# Patient Record
Sex: Male | Born: 1950 | Race: White | Hispanic: No | Marital: Married | Smoking: Never smoker
Health system: Southern US, Community
[De-identification: ages and names within clinical notes are randomized; demographics above are authoritative.]

## PROBLEM LIST (undated history)

## (undated) DIAGNOSIS — K219 Gastro-esophageal reflux disease without esophagitis: Secondary | ICD-10-CM

## (undated) DIAGNOSIS — K469 Unspecified abdominal hernia without obstruction or gangrene: Secondary | ICD-10-CM

## (undated) DIAGNOSIS — C801 Malignant (primary) neoplasm, unspecified: Secondary | ICD-10-CM

## (undated) DIAGNOSIS — D649 Anemia, unspecified: Secondary | ICD-10-CM

## (undated) DIAGNOSIS — J45909 Unspecified asthma, uncomplicated: Secondary | ICD-10-CM

## (undated) DIAGNOSIS — T4145XA Adverse effect of unspecified anesthetic, initial encounter: Secondary | ICD-10-CM

## (undated) DIAGNOSIS — T8859XA Other complications of anesthesia, initial encounter: Secondary | ICD-10-CM

## (undated) DIAGNOSIS — I10 Essential (primary) hypertension: Secondary | ICD-10-CM

## (undated) HISTORY — DX: Unspecified asthma, uncomplicated: J45.909

## (undated) HISTORY — DX: Unspecified abdominal hernia without obstruction or gangrene: K46.9

## (undated) HISTORY — PX: COLONOSCOPY: SHX174

## (undated) HISTORY — DX: Malignant (primary) neoplasm, unspecified: C80.1

## (undated) HISTORY — DX: Essential (primary) hypertension: I10

---
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
## 1972-10-20 HISTORY — PX: NOSE SURGERY: SHX723

## 1998-10-20 HISTORY — PX: APPENDECTOMY: SHX54

## 2001-10-20 DIAGNOSIS — C801 Malignant (primary) neoplasm, unspecified: Secondary | ICD-10-CM

## 2001-10-20 HISTORY — DX: Malignant (primary) neoplasm, unspecified: C80.1

## 2003-10-21 DIAGNOSIS — I10 Essential (primary) hypertension: Secondary | ICD-10-CM

## 2003-10-21 HISTORY — DX: Essential (primary) hypertension: I10

## 2004-09-10 ENCOUNTER — Ambulatory Visit: Payer: Self-pay | Admitting: General Surgery

## 2004-10-20 HISTORY — PX: CHOLECYSTECTOMY: SHX55

## 2005-01-29 ENCOUNTER — Ambulatory Visit: Payer: Self-pay | Admitting: General Surgery

## 2009-06-21 ENCOUNTER — Ambulatory Visit: Payer: Self-pay | Admitting: General Surgery

## 2010-04-16 ENCOUNTER — Ambulatory Visit: Payer: Self-pay | Admitting: General Surgery

## 2012-08-04 DIAGNOSIS — D649 Anemia, unspecified: Secondary | ICD-10-CM | POA: Insufficient documentation

## 2013-04-29 ENCOUNTER — Encounter: Payer: Self-pay | Admitting: *Deleted

## 2013-04-29 DIAGNOSIS — C7A8 Other malignant neuroendocrine tumors: Secondary | ICD-10-CM | POA: Insufficient documentation

## 2013-04-29 DIAGNOSIS — C801 Malignant (primary) neoplasm, unspecified: Secondary | ICD-10-CM | POA: Insufficient documentation

## 2015-04-17 ENCOUNTER — Ambulatory Visit: Payer: Self-pay | Admitting: General Surgery

## 2015-08-15 ENCOUNTER — Ambulatory Visit: Payer: Self-pay | Admitting: General Surgery

## 2015-08-27 ENCOUNTER — Encounter: Payer: Self-pay | Admitting: General Surgery

## 2015-08-28 ENCOUNTER — Encounter: Payer: Self-pay | Admitting: General Surgery

## 2015-08-28 ENCOUNTER — Ambulatory Visit (INDEPENDENT_AMBULATORY_CARE_PROVIDER_SITE_OTHER): Payer: BLUE CROSS/BLUE SHIELD | Admitting: General Surgery

## 2015-08-28 VITALS — BP 138/76 | HR 78 | Resp 16 | Ht 66.0 in | Wt 208.0 lb

## 2015-08-28 DIAGNOSIS — Z1211 Encounter for screening for malignant neoplasm of colon: Secondary | ICD-10-CM

## 2015-08-28 DIAGNOSIS — K603 Anal fistula: Secondary | ICD-10-CM | POA: Diagnosis not present

## 2015-08-28 DIAGNOSIS — Z86010 Personal history of colonic polyps: Secondary | ICD-10-CM | POA: Diagnosis not present

## 2015-08-28 MED ORDER — POLYETHYLENE GLYCOL 3350 17 GM/SCOOP PO POWD: 1.0000 | Freq: Once | ORAL | Status: DC

## 2015-08-28 NOTE — Patient Instructions (Addendum)
Colonoscopy A colonoscopy is an exam to look at your colon. This exam can help find lumps (tumors), growths (polyps), bleeding, and redness and puffiness (inflammation) in your colon.  BEFORE THE PROCEDURE  Ask your doctor about changing or stopping your regular medicines.  You may need to drink a large amount of a special liquid (oral bowel prep). You start drinking this the day before your procedure. It will cause you to have watery poop (stool). This cleans out your colon.  Do not eat or drink anything else once you have started the bowel prep, unless your doctor tells you it is safe to do so.  Make plans for someone to drive you home after the procedure. PROCEDURE  You will be given medicine to help you relax (sedative).  You will lie on your side with your knees bent.  A tube with a camera on the end is put in the opening of your butt (anus) and into your colon. Pictures are sent to a computer screen. Your doctor will look for anything that is not normal.  Your doctor may take a tissue sample (biopsy) from your colon to be looked at more closely.  The exam is finished when your doctor has viewed all of the colon. AFTER THE PROCEDURE  Do not drive for 24 hours after the exam.  You may have a small amount of blood in your poop. This is normal.  You may pass gas and have belly (abdominal) cramps. This is normal.  Ask when your test results will be ready. Make sure you get your test results.   This information is not intended to replace advice given to you by your health care provider. Make sure you discuss any questions you have with your health care provider.   Document Released: 11/08/2010 Document Revised: 10/11/2013 Document Reviewed: 06/13/2013 Elsevier Interactive Patient Education [employer].  Patient is scheduled for a Colonoscopy and Press photographer at [facility] on 09/11/15. He will pre admit by phone. He will stop his Fish Oil one week prior. He will only take his  Amlodipine at 6 am with a small sip of water. Miralax prescription has been sent into his pharmacy. Patient is aware of date and instructions.

## 2015-08-28 NOTE — Progress Notes (Signed)
Patient ID: Ronnie Reyes. Ronnie Reyes, male   DOB: 1951/10/07, 64 y.o.   MRN: [id number]  Chief Complaint  Patient presents with  . Colonoscopy    HPI Ronnie Reyes is a 64 y.o. male here today for an evaluation for a colonoscopy. His last one was done in 2011. No dietary or bowel issues, moves his bowels regularly.   The patient reports since his last colonoscopy he has had intermittent swelling, discomfort and purulent drainage occurring on a monthly schedule from tissue posterior to the anus. Scant bleeding reported. Symptoms lasted for about 1 day. Not present prior to the colonoscopy.  Recently restarted on nebulizer treatments at home. HPI  Past Medical History  Diagnosis Date  . Hypertension 2005    since 2005  . Asthma   . Hernia   . Cancer [facility]) 2003    skin cancer/arm and back    Past Surgical History  Procedure Laterality Date  . Colonoscopy  4854,[id number]    small serrated adenoma removed from the rectum at 15cm. This was his first screening colonoscopy  . Cholecystectomy  2006  . Nose surgery  1974  . Ingrown toenail removal  1964  . Appendectomy  2000    Family History  Problem Relation Age of Onset  . Cancer Father     kidney    Social History Social History  Substance Use Topics  . Smoking status: Never Smoker   . Smokeless tobacco: Never Used  . Alcohol Use: No    Allergies  Allergen Reactions  . Ace Inhibitors Shortness Of Breath  . Chlorthalidone Anaphylaxis    Current Outpatient Prescriptions  Medication Sig Dispense Refill  . albuterol (ACCUNEB) 0.63 MG/3ML nebulizer solution Take 1 ampule by nebulization every 6 (six) hours as needed for wheezing.    . Albuterol Sulfate (VENTOLIN HFA IN) Inhale 2 puffs into the lungs as needed.    Marland Kitchen amLODipine (NORVASC) 10 MG tablet   0  . Cholecalciferol (D3 SUPER STRENGTH) 2000 UNITS CAPS Take by mouth.    . fexofenadine (ALLEGRA) 180 MG tablet Take 180 mg by mouth daily.    . fluticasone (FLONASE) 50 MCG/ACT  nasal spray   3  . Fluticasone-Salmeterol (ADVAIR DISKUS) 500-50 MCG/DOSE AEPB Inhale 1 puff into the lungs 2 (two) times daily.    . Glucos-Chondroit-Hyaluron-MSM (GLUCOSAMINE CHONDROITIN JOINT) TABS Take by mouth.    . lovastatin (MEVACOR) 20 MG tablet   0  . Multiple Vitamins-Minerals (CENTRUM SILVER PO) Take by mouth once.    . naproxen sodium (ALEVE) 220 MG tablet Take 220 mg by mouth 2 (two) times daily with a meal.    . niacin 500 MG tablet Take 500 mg by mouth at bedtime.    . Omega-3 Fatty Acids (FISH OIL) 1000 MG CAPS Take by mouth once.    . ranitidine (ZANTAC) 150 MG tablet Take 150 mg by mouth 2 (two) times daily.    . polyethylene glycol powder (GLYCOLAX/MIRALAX) powder Take 255 g by mouth once. 255 g 0   No current facility-administered medications for this visit.    Review of Systems Review of Systems  Constitutional: Negative.   Respiratory: Negative.   Cardiovascular: Negative.   Gastrointestinal: Negative.     Blood pressure 138/76, pulse 78, resp. rate 16, height 5\' 6"  (1.676 m), weight 208 lb (94.348 kg), SpO2 93 %.  Physical Exam Physical Exam  Constitutional: He is oriented to person, place, and time. He appears well-developed and well-nourished.  Eyes: Conjunctivae are  normal. No scleral icterus.  Neck: Neck supple. No thyromegaly present.  Cardiovascular: Normal rate, regular rhythm and normal heart sounds.   Pulmonary/Chest: Effort normal. He has wheezes. He has rhonchi in the right lower field and the left lower field.  Mildly prolonged expiratory phase.  Genitourinary:     posterior fistula  Neurological: He is alert and oriented to person, place, and time.  Skin: Skin is warm and dry.    Data Reviewed Serrated adenoma removed from the rectum 2006. Normal colonoscopy exam 2011.  Chest x-ray obtained 08/25/2014 for wheezing showed no cardiopulmonary disease. Degenerative disease of the thoracic spine appreciated.  Assessment    Previous  colonic polyp.  Posterior anal fissure.  Hyperactive airway disease with adequate oxygenation.    Plan    The fistula can be dealt with at the time of his upcoming colonoscopy. This will be done in the operating room under anesthesia. If a deep fissure was identified, highly unlikely, would likely not perform fistulotomy. Risks of anal incontinence were reviewed.     Colonoscopy with possible biopsy/polypectomy prn: Information regarding the procedure, including its potential risks and complications (including but not limited to perforation of the bowel, which may require emergency surgery to repair, and bleeding) was verbally given to the patient. Educational information regarding lower intestinal endoscopy was given to the patient. Written instructions for how to complete the bowel prep using Miralax were provided. The importance of drinking ample fluids to avoid dehydration as a result of the prep emphasized.  Discuss posterior fistula with patient.   Patient is scheduled for a Colonoscopy and Anal Fistulotomy at [facility] on 09/11/15. He will pre admit by phone. He will stop his Fish Oil one week prior. He will only take his Amlodipine at 6 am with a small sip of water. Miralax prescription has been sent into his pharmacy. Patient is aware of date and instructions.   PCP: Ronnie Curry, MD   Ronnie Reyes 08/29/2015, 5:42 AM

## 2015-08-29 ENCOUNTER — Telehealth: Payer: Self-pay | Admitting: *Deleted

## 2015-08-29 DIAGNOSIS — Z1211 Encounter for screening for malignant neoplasm of colon: Secondary | ICD-10-CM | POA: Insufficient documentation

## 2015-08-29 DIAGNOSIS — Z86010 Personal history of colonic polyps: Secondary | ICD-10-CM | POA: Insufficient documentation

## 2015-08-29 DIAGNOSIS — K603 Anal fistula: Secondary | ICD-10-CM | POA: Insufficient documentation

## 2015-08-29 NOTE — Telephone Encounter (Signed)
Patient's wife called the office to report that they would like to cancel colonoscopy and anal fistulotomy that was scheduled for 09-11-15 at [facility]. Patient's insurance will be changing in December 2016 and they would like to wait and have done in January 2016 due to associated costs.   Trish in Endoscopy has been notified of cancellation.  Cancellation form was also faxed to the O.R.   Patient will be contacted once January schedule is available to arrange date.

## 2015-09-05 ENCOUNTER — Telehealth: Payer: Self-pay | Admitting: *Deleted

## 2015-09-05 NOTE — Telephone Encounter (Signed)
Patient has been scheduled for a colonoscopy and anal fistulotomy on 11-03-15 at [facility].  This patient has been asked to discontinue fish oil one week prior to procedure.

## 2015-09-11 ENCOUNTER — Ambulatory Visit: Admit: 2015-09-11 | Payer: Self-pay | Admitting: General Surgery

## 2015-09-11 SURGERY — COLONOSCOPY WITH PROPOFOL
Anesthesia: General

## 2015-09-25 ENCOUNTER — Encounter: Payer: Self-pay | Admitting: General Surgery

## 2015-09-27 ENCOUNTER — Encounter: Payer: Self-pay | Admitting: General Surgery

## 2015-10-10 ENCOUNTER — Other Ambulatory Visit: Payer: Self-pay | Admitting: General Surgery

## 2015-10-10 DIAGNOSIS — Z86010 Personal history of colonic polyps: Principal | ICD-10-CM

## 2015-10-10 DIAGNOSIS — Z1211 Encounter for screening for malignant neoplasm of colon: Secondary | ICD-10-CM

## 2015-10-10 DIAGNOSIS — K603 Anal fistula: Secondary | ICD-10-CM

## 2015-10-12 ENCOUNTER — Telehealth: Payer: Self-pay

## 2015-10-12 NOTE — Telephone Encounter (Signed)
Patient's wife called and wanted to cancel patient's surgery scheduled for 10/24/15. She is having trouble with his supplemental insurance and will call back to reschedule this once this issue is resolved. OR notified of cancellation and Almyra Free in Endoscopy notified of cancellation.

## 2015-10-16 ENCOUNTER — Other Ambulatory Visit: Payer: Self-pay

## 2015-10-24 ENCOUNTER — Encounter: Admission: RE | Payer: Self-pay | Source: Ambulatory Visit

## 2015-10-24 ENCOUNTER — Ambulatory Visit
Admission: RE | Admit: 2015-10-24 | Payer: BLUE CROSS/BLUE SHIELD | Source: Ambulatory Visit | Admitting: General Surgery

## 2015-10-24 SURGERY — ANAL FISTULOTOMY
Anesthesia: Choice

## 2015-10-24 SURGERY — COLONOSCOPY WITH PROPOFOL
Anesthesia: General

## 2015-11-12 ENCOUNTER — Other Ambulatory Visit: Payer: Self-pay | Admitting: *Deleted

## 2015-11-12 NOTE — Progress Notes (Signed)
Patient's colonoscopy and anal fistulotomy has been scheduled for 01-02-16 at [facility].

## 2015-12-25 ENCOUNTER — Other Ambulatory Visit: Payer: BLUE CROSS/BLUE SHIELD

## 2015-12-25 ENCOUNTER — Encounter: Payer: Self-pay | Admitting: *Deleted

## 2015-12-25 NOTE — Patient Instructions (Signed)
  Your procedure is scheduled on: 01-02-16 [facility]) Report to [place] To find out your arrival time please call [phone] between 1PM - 3PM on 01-01-16 (TUESDAY)  Remember: Instructions that are not followed completely may result in serious medical risk, up to and including death, or upon the discretion of your surgeon and anesthesiologist your surgery may need to be rescheduled.    _X___ 1. Do not eat food or drink liquids after midnight. No gum chewing or hard candies.     _X___ 2. No Alcohol for 24 hours before or after surgery.   ____ 3. Bring all medications with you on the day of surgery if instructed.    _X___ 4. Notify your doctor if there is any change in your medical condition     (cold, fever, infections).     Do not wear jewelry, make-up, hairpins, clips or nail polish.  Do not wear lotions, powders, or perfumes. You may wear deodorant.  Do not shave 48 hours prior to surgery. Men may shave face and neck.  Do not bring valuables to the hospital.    [facility] is not responsible for any belongings or valuables.               Contacts, dentures or bridgework may not be worn into surgery.  Leave your suitcase in the car. After surgery it may be brought to your room.  For patients admitted to the hospital, discharge time is determined by your treatment team.   Patients discharged the day of surgery will not be allowed to drive home.   Please read over the following fact sheets that you were given:    _X___ Take these medicines the morning of surgery with A SIP OF WATER:    1. AMLODIPINE (NORVASC)  2. ZANTAC (RANITIDINE)  3.   4.  5.  6.  ____ Fleet Enema (as directed)   ____ Use CHG Soap as directed  _X___ Use inhalers on the day of surgery-USE ADVAIR AND ALBUTEROL NEBULIZER AT HOME. BRING ALBUTEROL [place]  ____ Stop metformin 2 days prior to surgery    ____ Take 1/2 of usual insulin dose the night before  surgery and none on the morning of surgery.   ____ Stop Coumadin/Plavix/aspirin-N/A  ____ Stop Anti-inflammatories-NO NSAIDS OR ASPIRIN PRODUCTS-TYLENOL OK TO TAKE   _X___ Stop supplements until after surgery-STOP GLUCOSAMINE-CHONDROITIN NOW  ____ Bring C-Pap to the hospital.

## 2015-12-26 ENCOUNTER — Encounter
Admission: RE | Admit: 2015-12-26 | Discharge: 2015-12-26 | Disposition: A | Discharge status: Home / Self Care | Payer: Medicare Other | Source: Ambulatory Visit | Attending: General Surgery | Admitting: General Surgery

## 2015-12-26 DIAGNOSIS — Z01810 Encounter for preprocedural cardiovascular examination: Secondary | ICD-10-CM | POA: Diagnosis present

## 2015-12-26 DIAGNOSIS — Z01812 Encounter for preprocedural laboratory examination: Secondary | ICD-10-CM | POA: Insufficient documentation

## 2015-12-26 LAB — HEMOGLOBIN: Hemoglobin: 16.9 g/dL (ref 13.0–18.0)

## 2015-12-31 ENCOUNTER — Telehealth: Payer: Self-pay | Admitting: *Deleted

## 2015-12-31 NOTE — Telephone Encounter (Signed)
Patient was contacted today and confirms no medication changes since last office visit.   This patient reports that he has picked up Miralax prescription.   We will proceed with colonoscopy and anal fistulotomy as scheduled for 01-02-16 at [facility].   Patient was instructed to call the office should he have further questions.

## 2016-01-01 ENCOUNTER — Encounter: Payer: Self-pay | Admitting: *Deleted

## 2016-01-02 ENCOUNTER — Encounter: Payer: Self-pay | Admitting: *Deleted

## 2016-01-02 ENCOUNTER — Ambulatory Visit: Payer: Medicare Other | Admitting: Anesthesiology

## 2016-01-02 ENCOUNTER — Encounter
Admission: RE | Disposition: A | Discharge status: Home / Self Care | Payer: Self-pay | Source: Ambulatory Visit | Attending: General Surgery

## 2016-01-02 ENCOUNTER — Ambulatory Visit
Admission: RE | Admit: 2016-01-02 | Discharge: 2016-01-02 | Disposition: A | Discharge status: Home / Self Care | Payer: Medicare Other | Source: Ambulatory Visit | Attending: General Surgery | Admitting: General Surgery

## 2016-01-02 DIAGNOSIS — Z1211 Encounter for screening for malignant neoplasm of colon: Secondary | ICD-10-CM | POA: Diagnosis present

## 2016-01-02 DIAGNOSIS — K219 Gastro-esophageal reflux disease without esophagitis: Secondary | ICD-10-CM | POA: Diagnosis not present

## 2016-01-02 DIAGNOSIS — Z888 Allergy status to other drugs, medicaments and biological substances status: Secondary | ICD-10-CM | POA: Diagnosis not present

## 2016-01-02 DIAGNOSIS — K603 Anal fistula: Secondary | ICD-10-CM | POA: Diagnosis not present

## 2016-01-02 DIAGNOSIS — I10 Essential (primary) hypertension: Secondary | ICD-10-CM | POA: Insufficient documentation

## 2016-01-02 DIAGNOSIS — Z9049 Acquired absence of other specified parts of digestive tract: Secondary | ICD-10-CM | POA: Diagnosis not present

## 2016-01-02 DIAGNOSIS — Z86010 Personal history of colonic polyps: Secondary | ICD-10-CM | POA: Diagnosis not present

## 2016-01-02 DIAGNOSIS — J45909 Unspecified asthma, uncomplicated: Secondary | ICD-10-CM | POA: Insufficient documentation

## 2016-01-02 DIAGNOSIS — D649 Anemia, unspecified: Secondary | ICD-10-CM | POA: Diagnosis not present

## 2016-01-02 DIAGNOSIS — Z79899 Other long term (current) drug therapy: Secondary | ICD-10-CM | POA: Insufficient documentation

## 2016-01-02 DIAGNOSIS — Z7951 Long term (current) use of inhaled steroids: Secondary | ICD-10-CM | POA: Diagnosis not present

## 2016-01-02 DIAGNOSIS — Z85828 Personal history of other malignant neoplasm of skin: Secondary | ICD-10-CM | POA: Insufficient documentation

## 2016-01-02 HISTORY — DX: Gastro-esophageal reflux disease without esophagitis: K21.9

## 2016-01-02 HISTORY — DX: Other complications of anesthesia, initial encounter: T88.59XA

## 2016-01-02 HISTORY — PX: COLONOSCOPY WITH PROPOFOL: SHX5780

## 2016-01-02 HISTORY — DX: Anemia, unspecified: D64.9

## 2016-01-02 HISTORY — PX: ANAL FISTULOTOMY: SHX6423

## 2016-01-02 HISTORY — DX: Adverse effect of unspecified anesthetic, initial encounter: T41.45XA

## 2016-01-02 SURGERY — COLONOSCOPY WITH PROPOFOL
Anesthesia: General

## 2016-01-02 SURGERY — ANAL FISTULOTOMY
Anesthesia: General

## 2016-01-02 MED ORDER — SODIUM CHLORIDE 0.9 % IV SOLN
INTRAVENOUS | Status: DC
  Administered 2016-01-02: 10:00:00 via INTRAVENOUS

## 2016-01-02 MED ORDER — METHYLPREDNISOLONE SODIUM SUCC 125 MG IJ SOLR
INTRAMUSCULAR | Status: DC | PRN
  Administered 2016-01-02: 125 mg via INTRAVENOUS

## 2016-01-02 MED ORDER — FENTANYL CITRATE (PF) 100 MCG/2ML IJ SOLN: 25.0000 ug | INTRAMUSCULAR | Status: DC | PRN

## 2016-01-02 MED ORDER — MIDAZOLAM HCL 5 MG/5ML IJ SOLN
INTRAMUSCULAR | Status: DC | PRN
  Administered 2016-01-02: 2 mg via INTRAVENOUS

## 2016-01-02 MED ORDER — PROPOFOL 10 MG/ML IV BOLUS
INTRAVENOUS | Status: DC | PRN
  Administered 2016-01-02: 150 mg via INTRAVENOUS

## 2016-01-02 MED ORDER — ONDANSETRON HCL 4 MG/2ML IJ SOLN: 4.0000 mg | Freq: Once | INTRAMUSCULAR | Status: DC | PRN

## 2016-01-02 MED ORDER — ROCURONIUM BROMIDE 100 MG/10ML IV SOLN
INTRAVENOUS | Status: DC | PRN
  Administered 2016-01-02: 10 mg via INTRAVENOUS

## 2016-01-02 MED ORDER — LIDOCAINE HCL (CARDIAC) 20 MG/ML IV SOLN
INTRAVENOUS | Status: DC | PRN
  Administered 2016-01-02: 60 mg via INTRAVENOUS

## 2016-01-02 MED ORDER — PROPOFOL 500 MG/50ML IV EMUL
INTRAVENOUS | Status: DC | PRN
  Administered 2016-01-02: 120 ug/kg/min via INTRAVENOUS

## 2016-01-02 MED ORDER — LIDOCAINE HCL (CARDIAC) 20 MG/ML IV SOLN
INTRAVENOUS | Status: DC | PRN
  Administered 2016-01-02: 80 mg via INTRAVENOUS

## 2016-01-02 MED ORDER — SODIUM CHLORIDE 0.9 % IV SOLN
INTRAVENOUS | Status: DC | PRN
  Administered 2016-01-02 (×2): via INTRAVENOUS

## 2016-01-02 MED ORDER — FENTANYL CITRATE (PF) 100 MCG/2ML IJ SOLN
INTRAMUSCULAR | Status: DC | PRN
  Administered 2016-01-02 (×2): 50 ug via INTRAVENOUS

## 2016-01-02 MED ORDER — SODIUM CHLORIDE 0.9 % IV SOLN
1.0000 g | INTRAVENOUS | Status: DC
  Filled 2016-01-02: qty 1

## 2016-01-02 MED ORDER — GLYCOPYRROLATE 0.2 MG/ML IJ SOLN
INTRAMUSCULAR | Status: DC | PRN
  Administered 2016-01-02: 0.2 mg via INTRAVENOUS

## 2016-01-02 MED ORDER — BUPIVACAINE-EPINEPHRINE (PF) 0.5% -1:200000 IJ SOLN
INTRAMUSCULAR | Status: AC
  Filled 2016-01-02: qty 30

## 2016-01-02 MED ORDER — BUPIVACAINE LIPOSOME 1.3 % IJ SUSP
INTRAMUSCULAR | Status: AC
  Filled 2016-01-02: qty 20

## 2016-01-02 MED ORDER — SODIUM CHLORIDE 0.9 % IV SOLN
INTRAVENOUS | Status: DC | PRN
  Administered 2016-01-02: 11:00:00 via INTRAVENOUS

## 2016-01-02 MED ORDER — KETOROLAC TROMETHAMINE 30 MG/ML IJ SOLN
INTRAMUSCULAR | Status: DC | PRN
  Administered 2016-01-02: 30 mg via INTRAVENOUS

## 2016-01-02 MED ORDER — BUPIVACAINE-EPINEPHRINE 0.5% -1:200000 IJ SOLN
INTRAMUSCULAR | Status: DC | PRN
  Administered 2016-01-02: 30 mL

## 2016-01-02 MED ORDER — SUCCINYLCHOLINE CHLORIDE 20 MG/ML IJ SOLN
INTRAMUSCULAR | Status: DC | PRN
  Administered 2016-01-02: 100 mg via INTRAVENOUS

## 2016-01-02 MED ORDER — PROPOFOL 10 MG/ML IV BOLUS
INTRAVENOUS | Status: DC | PRN
  Administered 2016-01-02: 40 mg via INTRAVENOUS

## 2016-01-02 MED ORDER — ACETAMINOPHEN 10 MG/ML IV SOLN
INTRAVENOUS | Status: AC
  Filled 2016-01-02: qty 100

## 2016-01-02 MED ORDER — HYDROCODONE-ACETAMINOPHEN 5-325 MG PO TABS: 1.0000 | ORAL_TABLET | ORAL | Status: DC | PRN

## 2016-01-02 MED ORDER — ACETAMINOPHEN 10 MG/ML IV SOLN
INTRAVENOUS | Status: DC | PRN
  Administered 2016-01-02: 1000 mg via INTRAVENOUS

## 2016-01-02 MED ORDER — SODIUM CHLORIDE 0.9 % IV SOLN
1.0000 g | INTRAVENOUS | Status: DC | PRN
  Administered 2016-01-02: 1 g via INTRAVENOUS

## 2016-01-02 MED ORDER — ONDANSETRON HCL 4 MG/2ML IJ SOLN
INTRAMUSCULAR | Status: DC | PRN
  Administered 2016-01-02: 4 mg via INTRAVENOUS

## 2016-01-02 SURGICAL SUPPLY — 28 items
BLADE SURG 15 STRL SS SAFETY (BLADE) ×3 IMPLANT
BRIEF STRETCH MATERNITY 2XLG (MISCELLANEOUS) ×3 IMPLANT
CANISTER SUCT 1200ML W/VALVE (MISCELLANEOUS) ×3 IMPLANT
DRAPE LAPAROTOMY 100X77 ABD (DRAPES) ×3 IMPLANT
DRAPE LEGGINS SURG 28X43 STRL (DRAPES) ×3 IMPLANT
DRAPE UNDER BUTTOCK W/FLU (DRAPES) ×3 IMPLANT
DRSG GAUZE PETRO 6X36 STRIP ST (GAUZE/BANDAGES/DRESSINGS) ×3 IMPLANT
ELECT REM PT RETURN 9FT ADLT (ELECTROSURGICAL) ×3
ELECTRODE REM PT RTRN 9FT ADLT (ELECTROSURGICAL) ×1 IMPLANT
GLOVE BIO SURGEON STRL SZ7.5 (GLOVE) ×3 IMPLANT
GLOVE INDICATOR 8.0 STRL GRN (GLOVE) ×3 IMPLANT
GOWN STRL REUS W/ TWL LRG LVL3 (GOWN DISPOSABLE) ×2 IMPLANT
GOWN STRL REUS W/TWL LRG LVL3 (GOWN DISPOSABLE) ×4
KIT RM TURNOVER STRD PROC AR (KITS) ×3 IMPLANT
LABEL OR SOLS (LABEL) ×3 IMPLANT
NDL SAFETY 22GX1.5 (NEEDLE) ×3 IMPLANT
NEEDLE HYPO 25X1 1.5 SAFETY (NEEDLE) ×3 IMPLANT
NS IRRIG 500ML POUR BTL (IV SOLUTION) ×3 IMPLANT
PACK BASIN MINOR ARMC (MISCELLANEOUS) ×3 IMPLANT
PAD OB MATERNITY 4.3X12.25 (PERSONAL CARE ITEMS) ×3 IMPLANT
PAD PREP 24X41 OB/GYN DISP (PERSONAL CARE ITEMS) ×3 IMPLANT
SOL PREP PVP 2OZ (MISCELLANEOUS) ×3
SOLUTION PREP PVP 2OZ (MISCELLANEOUS) ×1 IMPLANT
SURGILUBE 2OZ TUBE FLIPTOP (MISCELLANEOUS) ×3 IMPLANT
SUT SILK 0 CT 1 30 (SUTURE) ×3 IMPLANT
SUT VIC AB 3-0 SH 27 (SUTURE) ×2
SUT VIC AB 3-0 SH 27X BRD (SUTURE) ×1 IMPLANT
SYR CONTROL 10ML (SYRINGE) ×3 IMPLANT

## 2016-01-02 NOTE — Anesthesia Postprocedure Evaluation (Signed)
Anesthesia Post Note  Patient: Ronnie Reyes  Procedure(s) Performed: Procedure(s) (LRB): COLONOSCOPY WITH PROPOFOL (N/A)  Patient location during evaluation: PACU Anesthesia Type: General Level of consciousness: awake and alert and oriented Pain management: pain level controlled Vital Signs Assessment: post-procedure vital signs reviewed and stable Respiratory status: spontaneous breathing Cardiovascular status: blood pressure returned to baseline Anesthetic complications: no    Last Vitals:  Filed Vitals:   01/02/16 1400 01/02/16 1415  BP: 119/75 142/60  Pulse: 86 83  Temp: 36 C   Resp: 16 16    Last Pain:  Filed Vitals:   01/02/16 1426  PainSc: 0-No pain                 Jaycelynn Knickerbocker

## 2016-01-02 NOTE — Transfer of Care (Signed)
Immediate Anesthesia Transfer of Care Note  Patient: Ronnie Reyes  Procedure(s) Performed: Procedure(s): ANAL FISTULOTOMY (N/A)  Patient Location: PACU  Anesthesia Type:General  Level of Consciousness: sedated  Airway & Oxygen Therapy: Patient Spontanous Breathing and Patient connected to face mask oxygen  Post-op Assessment: Report given to RN and Post -op Vital signs reviewed and stable  Post vital signs: Reviewed and stable  Last Vitals:  Filed Vitals:   01/02/16 1203 01/02/16 1301  BP: 126/85 140/75  Pulse: 85 92  Temp: 36.2 C 36.2 C  Resp: 28 21    Complications: No apparent anesthesia complications

## 2016-01-02 NOTE — Transfer of Care (Signed)
Immediate Anesthesia Transfer of Care Note  Patient: Ronnie Reyes  Procedure(s) Performed: Procedure(s): COLONOSCOPY WITH PROPOFOL (N/A)  Patient Location: Endoscopy Unit  Anesthesia Type:General  Level of Consciousness: awake, alert , oriented and patient cooperative  Airway & Oxygen Therapy: Patient Spontanous Breathing and Patient connected to nasal cannula oxygen  Post-op Assessment: Report given to RN, Post -op Vital signs reviewed and stable and Patient moving all extremities X 4  Post vital signs: Reviewed and stable  Last Vitals:  Filed Vitals:   01/02/16 0957  BP: 153/99  Pulse: 87  Temp: 37.2 C  Resp: 16    Complications: No apparent anesthesia complications

## 2016-01-02 NOTE — Progress Notes (Signed)
abx Colbert Ewing will be sent to ENDO with patient

## 2016-01-02 NOTE — Anesthesia Preprocedure Evaluation (Signed)
Anesthesia Evaluation  Patient identified by MRN, date of birth, ID band Patient awake  General Assessment Comment:Hard time waking up after appendectomy  Reviewed: Allergy & Precautions, NPO status , Patient's Chart, lab work & pertinent test results  History of Anesthesia Complications (+) history of anesthetic complications  Airway Mallampati: II       Dental  (+) Caps, Chipped   Pulmonary asthma , COPD,  COPD inhaler,    Pulmonary exam normal breath sounds clear to auscultation       Cardiovascular hypertension, Pt. on medications Normal cardiovascular exam     Neuro/Psych    GI/Hepatic GERD  Medicated and Controlled,Anal fissure..     Endo/Other  negative endocrine ROS  Renal/GU negative Renal ROS  negative genitourinary   Musculoskeletal negative musculoskeletal ROS (+)   Abdominal Normal abdominal exam  (+)   Peds negative pediatric ROS (+)  Hematology  (+) anemia ,   Anesthesia Other Findings Skin CA  Reproductive/Obstetrics                             Anesthesia Physical Anesthesia Plan  ASA: III  Anesthesia Plan: General   Post-op Pain Management:    Induction: Intravenous  Airway Management Planned: Oral ETT  Additional Equipment:   Intra-op Plan:   Post-operative Plan: Extubation in OR  Informed Consent: I have reviewed the patients History and Physical, chart, labs and discussed the procedure including the risks, benefits and alternatives for the proposed anesthesia with the patient or authorized representative who has indicated his/her understanding and acceptance.   Dental advisory given  Plan Discussed with: CRNA and Surgeon  Anesthesia Plan Comments:         Anesthesia Quick Evaluation

## 2016-01-02 NOTE — Progress Notes (Signed)
To ENDO via stretcher by Dessie Coma, CNA

## 2016-01-02 NOTE — OR Nursing (Signed)
Patient appears to be teary at times, but denies pain.

## 2016-01-02 NOTE — Anesthesia Postprocedure Evaluation (Signed)
Anesthesia Post Note  Patient: Ronnie Reyes  Procedure(s) Performed: Procedure(s) (LRB): ANAL FISTULOTOMY (N/A)  Patient location during evaluation: PACU Anesthesia Type: General Level of consciousness: awake, awake and alert and oriented Pain management: pain level controlled Vital Signs Assessment: post-procedure vital signs reviewed and stable Respiratory status: spontaneous breathing Cardiovascular status: blood pressure returned to baseline Anesthetic complications: no    Last Vitals:  Filed Vitals:   01/02/16 1400 01/02/16 1415  BP: 119/75 142/60  Pulse: 86 83  Temp: 36 C   Resp: 16 16    Last Pain:  Filed Vitals:   01/02/16 1426  PainSc: 0-No pain                 Renu Asby

## 2016-01-02 NOTE — Op Note (Signed)
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  Postoperative diagnosis: Same.  Operative procedure: Anoscopy, fistulotomy.  Operative surgeon: Ollen Bowl, M.D.  Anesthesia: Gen. endotracheal, Marcaine 0.5% with 1-200,000 units epinephrine, 30 cc local infiltration  Estimated blood loss: None.  Clinical note: This 65 year old male has had long-standing recurrent drainage from a nodule approximately 3 cm from the anus at the 6:00 position (dorsal lithotomy). Exam was consistent with a fistula in anal. Colonoscopy earlier today was unremarkable for rectal pathology.  The patient received Invanz prior to procedure.  Operative note: With the patient in dorsal lithotomy position under general anesthesia the area was prepped with Betadine solution and draped. The fistula was cannulated with a lacrimal duct probe and a fistulotomy completed with cautery. This was fairly shallow through the superficial sphincter. Bleeding was controlled with electrocautery. The tract was curetted completely clean base. A Vaseline gauze pack was placed but quickly expelled when the patient coughed. A dry pad was placed and the patient was taken to recovery room in stable condition.

## 2016-01-02 NOTE — Anesthesia Procedure Notes (Signed)
Procedure Name: Intubation Date/Time: 01/02/2016 12:15 PM Performed by: Delaney Meigs Pre-anesthesia Checklist: Patient identified, Emergency Drugs available, Suction available, Patient being monitored and Timeout performed Patient Re-evaluated:Patient Re-evaluated prior to inductionOxygen Delivery Method: Circle system utilized Preoxygenation: Pre-oxygenation with 100% oxygen Intubation Type: IV induction Ventilation: Mask ventilation without difficulty Laryngoscope Size: Mac and 3 Tube type: Oral Tube size: 7.5 mm Number of attempts: 1 Airway Equipment and Method: Stylet Placement Confirmation: ETT inserted through vocal cords under direct vision,  positive ETCO2 and breath sounds checked- equal and bilateral Secured at: 22 cm Tube secured with: Tape Dental Injury: Teeth and Oropharynx as per pre-operative assessment

## 2016-01-02 NOTE — H&P (Signed)
Ronnie Reyes [id number] 04/15/1951     HPI: Long standing anal fistula. For colonscopy, fistulotomy. Did experience N&V w/ prep. Last BM liquid.  No change in health since December 2016 exam. Continues with monthly fistula drainage.   Prescriptions prior to admission  Medication Sig Dispense Refill Last Dose  . albuterol (ACCUNEB) 0.63 MG/3ML nebulizer solution Take 1 ampule by nebulization every 6 (six) hours as needed for wheezing.   01/02/2016 at 0600  . Albuterol Sulfate (VENTOLIN HFA IN) Inhale 2 puffs into the lungs as needed.   01/02/2016 at 0600  . amLODipine (NORVASC) 10 MG tablet Take 10 mg by mouth every morning.   0 01/02/2016 at 0600  . Cholecalciferol (D3 SUPER STRENGTH) 2000 UNITS CAPS Take by mouth.   01/01/2016 at am  . fexofenadine (ALLEGRA) 180 MG tablet Take 180 mg by mouth daily.   01/01/2016 at am  . fluticasone (FLONASE) 50 MCG/ACT nasal spray Place 1 spray into both nostrils daily.   3 01/01/2016 at am  . Fluticasone-Salmeterol (ADVAIR DISKUS) 500-50 MCG/DOSE AEPB Inhale 1 puff into the lungs every morning.    01/02/2016 at 0600  . Glucos-Chondroit-Hyaluron-MSM (GLUCOSAMINE CHONDROITIN JOINT) TABS Take by mouth.   12/26/2015  . Iron TABS Take 1 tablet by mouth every morning.   12/26/2015  . lovastatin (MEVACOR) 20 MG tablet Take 20 mg by mouth daily at 6 PM.   0 01/01/2016 at pm  . Multiple Vitamins-Minerals (CENTRUM SILVER PO) Take by mouth once.   12/26/2015  . niacin 500 MG tablet Take 500 mg by mouth at bedtime.   01/01/2016 at am  . Omega-3 Fatty Acids (FISH OIL) 1000 MG CAPS Take by mouth once.   12/18/2015  . polyethylene glycol powder (GLYCOLAX/MIRALAX) powder Take 255 g by mouth once. 255 g 0 01/01/2016  . ranitidine (ZANTAC) 150 MG tablet Take 150 mg by mouth every morning.    01/02/2016 at 0600   Allergies  Allergen Reactions  . Ace Inhibitors Shortness Of Breath  . Chlorthalidone Anaphylaxis   Past Medical History  Diagnosis Date  . Hypertension 2005    since  2005  . Asthma   . Hernia   . Cancer ([place]) 2003    skin cancer/arm and back  . GERD (gastroesophageal reflux disease)   . Anemia   . Complication of anesthesia     HARD TIME WAKING UP AFTER  APPENDECTOMY   Past Surgical History  Procedure Laterality Date  . Colonoscopy  [id number]    small serrated adenoma removed from the rectum at 15cm. This was his first screening colonoscopy  . Cholecystectomy  2006  . Nose surgery  1974  . Ingrown toenail removal  1964  . Appendectomy  2000   Social History   Social History  . Marital Status: Married    Spouse Name: N/A  . Number of Children: N/A  . Years of Education: N/A   Occupational History  . Not on file.   Social History Main Topics  . Smoking status: Never Smoker   . Smokeless tobacco: Never Used  . Alcohol Use: No  . Drug Use: No  . Sexual Activity: Not on file   Other Topics Concern  . Not on file   Social History Narrative   Social History   Social History Narrative     ROS: Negative.     PE: HEENT: Negative. Lungs: Clear. Cardio: RR. Robert Bellow 01/02/2016   Assessment/Plan:  Proceed with planned endoscopy and fistulotomy.

## 2016-01-02 NOTE — Anesthesia Preprocedure Evaluation (Addendum)
Anesthesia Evaluation  Patient identified by MRN, date of birth, ID band Patient awake  General Assessment Comment:Hard time waking up after appendectomy  Reviewed: Allergy & Precautions, NPO status , Patient's Chart, lab work & pertinent test results  History of Anesthesia Complications (+) history of anesthetic complications  Airway Mallampati: II       Dental  (+) Caps, Chipped   Pulmonary asthma , COPD,  COPD inhaler,    Pulmonary exam normal breath sounds clear to auscultation       Cardiovascular hypertension, Pt. on medications Normal cardiovascular exam     Neuro/Psych    GI/Hepatic GERD  Medicated and Controlled,Anal fissure..     Endo/Other  negative endocrine ROS  Renal/GU negative Renal ROS  negative genitourinary   Musculoskeletal negative musculoskeletal ROS (+)   Abdominal Normal abdominal exam  (+)   Peds negative pediatric ROS (+)  Hematology  (+) anemia ,   Anesthesia Other Findings Skin CA  Reproductive/Obstetrics                        Anesthesia Physical Anesthesia Plan  ASA: III  Anesthesia Plan: General   Post-op Pain Management:    Induction: Intravenous  Airway Management Planned: Nasal Cannula  Additional Equipment:   Intra-op Plan:   Post-operative Plan:   Informed Consent: I have reviewed the patients History and Physical, chart, labs and discussed the procedure including the risks, benefits and alternatives for the proposed anesthesia with the patient or authorized representative who has indicated his/her understanding and acceptance.   Dental advisory given  Plan Discussed with: CRNA and Surgeon  Anesthesia Plan Comments: (GOT for anal fissure surgery)       Anesthesia Quick Evaluation

## 2016-01-02 NOTE — Op Note (Signed)
[facility] Patient Name: Ronnie Reyes Procedure Date: 01/02/2016 11:30 AM MRN: [id number] Account #: [id number] Date of Birth: 08-10-1951 Admit Type: Outpatient Age: 65 Room: [facility] ENDO ROOM 3 Gender: Male Note Status: Finalized Procedure:            Colonoscopy Indications:          Screening for colorectal malignant neoplasm Providers:            Robert Bellow, MD Referring MD:         Atilano Median, MD (Referring MD) Medicines:            Monitored Anesthesia Care Complications:        No immediate complications. Procedure:            Pre-Anesthesia Assessment:                       - Prior to the procedure, a History and Physical was                        performed, and patient medications, allergies and                        sensitivities were reviewed. The patient's tolerance of                        previous anesthesia was reviewed.                       - The risks and benefits of the procedure and the                        sedation options and risks were discussed with the                        patient. All questions were answered and informed                        consent was obtained.                       After obtaining informed consent, the colonoscope was                        passed under direct vision. Throughout the procedure,                        the patient's blood pressure, pulse, and oxygen                        saturations were monitored continuously. The                        Colonoscope was introduced through the anus and                        advanced to the the cecum, identified by appendiceal                        orifice and ileocecal valve. The colonoscopy was  performed without difficulty. The patient tolerated the                        procedure well. The quality of the bowel preparation                        was excellent. Findings:      The entire examined colon  appeared normal on direct and retroflexion       views. Impression:           - The entire examined colon is normal on direct and                        retroflexion views.                       - No specimens collected. Recommendation:       - Repeat colonoscopy in 10 years for screening purposes. Procedure Code(s):    --- Professional ---                       [phone], Colonoscopy, flexible; diagnostic, including                        collection of specimen(s) by brushing or washing, when                        performed (separate procedure) Diagnosis Code(s):    --- Professional ---                       Z12.11, Encounter for screening for malignant neoplasm                        of colon CPT copyright 2016 [employer]. All rights reserved. The codes documented in this report are preliminary and upon coder review may  be revised to meet current compliance requirements. Robert Bellow, MD 01/02/2016 11:54:49 AM This report has been signed electronically. Number of Addenda: 0 Note Initiated On: 01/02/2016 11:30 AM Scope Withdrawal Time: 0 hours 7 minutes 12 seconds  Total Procedure Duration: 0 hours 12 minutes 25 seconds       [facility]

## 2016-01-10 ENCOUNTER — Encounter: Payer: Self-pay | Admitting: General Surgery

## 2016-01-10 ENCOUNTER — Ambulatory Visit (INDEPENDENT_AMBULATORY_CARE_PROVIDER_SITE_OTHER): Payer: Medicare Other | Admitting: General Surgery

## 2016-01-10 VITALS — BP 130/76 | HR 74 | Resp 16 | Ht 66.0 in | Wt 207.0 lb

## 2016-01-10 DIAGNOSIS — Z1211 Encounter for screening for malignant neoplasm of colon: Secondary | ICD-10-CM

## 2016-01-10 DIAGNOSIS — K603 Anal fistula: Secondary | ICD-10-CM

## 2016-01-10 DIAGNOSIS — Z86010 Personal history of colonic polyps: Secondary | ICD-10-CM

## 2016-01-10 NOTE — Progress Notes (Signed)
Patient ID: Ronnie Reyes, male   DOB: 06-30-1951, 65 y.o.   MRN: [id number]  Chief Complaint  Patient presents with  . Routine Post Op    colonoscopy and fistula    HPI Ronnie Reyes is a 65 y.o. male here today for his post op colonoscopy and fistula done on 01/02/16. Patient states no pain just some sorenessAt the anus. He has had a small amount of drainage and continues to make use of a peripads. No difficulty controlling bowel movements. HPI  Past Medical History  Diagnosis Date  . Hypertension 2005    since 2005  . Asthma   . Hernia   . Cancer ([place]) 2003    skin cancer/arm and back  . GERD (gastroesophageal reflux disease)   . Anemia   . Complication of anesthesia     HARD TIME WAKING UP AFTER  APPENDECTOMY    Past Surgical History  Procedure Laterality Date  . Colonoscopy  [id number]    small serrated adenoma removed from the rectum at 15cm. This was his first screening colonoscopy  . Cholecystectomy  2006  . Nose surgery  1974  . Ingrown toenail removal  1964  . Appendectomy  2000  . Colonoscopy with propofol N/A 01/02/2016    Procedure: COLONOSCOPY WITH PROPOFOL;  Surgeon: Robert Bellow, MD;  Location: [facility] ENDOSCOPY;  Service: Endoscopy;  Laterality: N/A;  . Anal fistulotomy N/A 01/02/2016    Procedure: ANAL FISTULOTOMY;  Surgeon: Robert Bellow, MD;  Location: [facility] ORS;  Service: General;  Laterality: N/A;    Family History  Problem Relation Age of Onset  . Cancer Father     kidney    Social History Social History  Substance Use Topics  . Smoking status: Never Smoker   . Smokeless tobacco: Never Used  . Alcohol Use: No    Allergies  Allergen Reactions  . Ace Inhibitors Shortness Of Breath  . Chlorthalidone Anaphylaxis    Current Outpatient Prescriptions  Medication Sig Dispense Refill  . albuterol (ACCUNEB) 0.63 MG/3ML nebulizer solution Take 1 ampule by nebulization every 6 (six) hours as needed for wheezing.    . Albuterol Sulfate  (VENTOLIN HFA IN) Inhale 2 puffs into the lungs as needed.    Marland Kitchen amLODipine (NORVASC) 10 MG tablet Take 10 mg by mouth every morning.   0  . Cholecalciferol (D3 SUPER STRENGTH) 2000 UNITS CAPS Take by mouth.    . fexofenadine (ALLEGRA) 180 MG tablet Take 180 mg by mouth daily.    . fluticasone (FLONASE) 50 MCG/ACT nasal spray Place 1 spray into both nostrils daily.   3  . Fluticasone-Salmeterol (ADVAIR DISKUS) 500-50 MCG/DOSE AEPB Inhale 1 puff into the lungs every morning.     . Glucos-Chondroit-Hyaluron-MSM (GLUCOSAMINE CHONDROITIN JOINT) TABS Take by mouth.    Marland Kitchen HYDROcodone-acetaminophen (NORCO) 5-325 MG tablet Take 1-2 tablets by mouth every 4 (four) hours as needed for moderate pain. 30 tablet 0  . Iron TABS Take 1 tablet by mouth every morning.    . lovastatin (MEVACOR) 20 MG tablet Take 20 mg by mouth daily at 6 PM.   0  . Multiple Vitamins-Minerals (CENTRUM SILVER PO) Take by mouth once.    . niacin 500 MG tablet Take 500 mg by mouth at bedtime.    . Omega-3 Fatty Acids (FISH OIL) 1000 MG CAPS Take by mouth once.    . ranitidine (ZANTAC) 150 MG tablet Take 150 mg by mouth every morning.      No  current facility-administered medications for this visit.    Review of Systems Review of Systems  Constitutional: Negative.   Respiratory: Negative.   Cardiovascular: Negative.     Blood pressure 130/76, pulse 74, resp. rate 16, height 5\' 6"  (1.676 m), weight 207 lb (93.895 kg).  Physical Exam Physical Exam  Genitourinary:       Data Reviewed Colonoscopy of 01/02/2016 was normal. Follow-up exam in 10 years.  Assessment    Good progress status post anal fistulotomy.    Plan    Follow-up examine in one month. Continue local wound care.     PCP:  Astrid Divine This information has been scribed by Gaspar Cola CMA.   Robert Bellow 01/11/2016, 8:10 AM

## 2016-01-10 NOTE — Patient Instructions (Signed)
Patient to return as needed. Recall for colonoscopy

## 2016-02-11 ENCOUNTER — Ambulatory Visit (INDEPENDENT_AMBULATORY_CARE_PROVIDER_SITE_OTHER): Payer: Medicare Other | Admitting: General Surgery

## 2016-02-11 ENCOUNTER — Encounter: Payer: Self-pay | Admitting: General Surgery

## 2016-02-11 VITALS — BP 136/80 | HR 78 | Resp 12 | Ht 66.0 in | Wt 206.0 lb

## 2016-02-11 DIAGNOSIS — K603 Anal fistula: Secondary | ICD-10-CM

## 2016-02-11 DIAGNOSIS — Z86010 Personal history of colonic polyps: Secondary | ICD-10-CM

## 2016-02-11 DIAGNOSIS — Z1211 Encounter for screening for malignant neoplasm of colon: Secondary | ICD-10-CM

## 2016-02-11 NOTE — Patient Instructions (Signed)
Patient to return asa needed.

## 2016-02-11 NOTE — Progress Notes (Signed)
Patient ID: Ronnie Reyes, male   DOB: 1951/05/23, 65 y.o.   MRN: [id number]  Chief Complaint  Patient presents with  . Follow-up    anual fissure    HPI Ronnie Reyes is a 65 y.o. male  here today for his post op colonoscopy and fistula done on 01/02/16. Patient states no pain or discomfort.  I personally reviewed the patient's history. HPI  Past Medical History  Diagnosis Date  . Hypertension 2005    since 2005  . Asthma   . Hernia   . Cancer ([place]) 2003    skin cancer/arm and back  . GERD (gastroesophageal reflux disease)   . Anemia   . Complication of anesthesia     HARD TIME WAKING UP AFTER  APPENDECTOMY    Past Surgical History  Procedure Laterality Date  . Colonoscopy  [id number]    small serrated adenoma removed from the rectum at 15cm. This was his first screening colonoscopy  . Cholecystectomy  2006  . Nose surgery  1974  . Ingrown toenail removal  1964  . Appendectomy  2000  . Colonoscopy with propofol N/A 01/02/2016    Procedure: COLONOSCOPY WITH PROPOFOL;  Surgeon: Robert Bellow, MD;  Location: [facility] ENDOSCOPY;  Service: Endoscopy;  Laterality: N/A;  . Anal fistulotomy N/A 01/02/2016    Procedure: ANAL FISTULOTOMY;  Surgeon: Robert Bellow, MD;  Location: [facility] ORS;  Service: General;  Laterality: N/A;    Family History  Problem Relation Age of Onset  . Cancer Father     kidney    Social History Social History  Substance Use Topics  . Smoking status: Never Smoker   . Smokeless tobacco: Never Used  . Alcohol Use: No    Allergies  Allergen Reactions  . Ace Inhibitors Shortness Of Breath  . Chlorthalidone Anaphylaxis    Current Outpatient Prescriptions  Medication Sig Dispense Refill  . albuterol (ACCUNEB) 0.63 MG/3ML nebulizer solution Take 1 ampule by nebulization every 6 (six) hours as needed for wheezing.    . Albuterol Sulfate (VENTOLIN HFA IN) Inhale 2 puffs into the lungs as needed.    Marland Kitchen amLODipine (NORVASC) 10 MG tablet Take 10 mg  by mouth every morning.   0  . Cholecalciferol (D3 SUPER STRENGTH) 2000 UNITS CAPS Take by mouth.    . fexofenadine (ALLEGRA) 180 MG tablet Take 180 mg by mouth daily.    . fluticasone (FLONASE) 50 MCG/ACT nasal spray Place 1 spray into both nostrils daily.   3  . Fluticasone-Salmeterol (ADVAIR DISKUS) 500-50 MCG/DOSE AEPB Inhale 1 puff into the lungs every morning.     . Glucos-Chondroit-Hyaluron-MSM (GLUCOSAMINE CHONDROITIN JOINT) TABS Take by mouth.    Marland Kitchen HYDROcodone-acetaminophen (NORCO) 5-325 MG tablet Take 1-2 tablets by mouth every 4 (four) hours as needed for moderate pain. 30 tablet 0  . Iron TABS Take 1 tablet by mouth every morning.    . lovastatin (MEVACOR) 20 MG tablet Take 20 mg by mouth daily at 6 PM.   0  . Multiple Vitamins-Minerals (CENTRUM SILVER PO) Take by mouth once.    . niacin 500 MG tablet Take 500 mg by mouth at bedtime.    . Omega-3 Fatty Acids (FISH OIL) 1000 MG CAPS Take by mouth once.    . ranitidine (ZANTAC) 150 MG tablet Take 150 mg by mouth every morning.      No current facility-administered medications for this visit.    Review of Systems Review of Systems  Constitutional: Negative.  Respiratory: Negative.   Cardiovascular: Negative.     Blood pressure 136/80, pulse 78, resp. rate 12, height 5\' 6"  (1.676 m), weight 206 lb (93.441 kg).  Physical Exam Physical Exam  Genitourinary:       Data Reviewed Colonoscopy was normal. Fistula was superficial.  Assessment    Doing well status post fistulotomy. Difficulty with bowel control    Plan    Follow-up will be in on as needed basis. Screening colonoscopy in 10 years.    PCP:  [place] This information has been scribed by Gaspar Cola CMA.    Robert Bellow 02/11/2016, 9:35 PM

## 2017-05-12 ENCOUNTER — Encounter: Payer: Self-pay | Admitting: *Deleted

## 2017-05-18 ENCOUNTER — Ambulatory Visit (INDEPENDENT_AMBULATORY_CARE_PROVIDER_SITE_OTHER): Payer: Medicare Other | Admitting: General Surgery

## 2017-05-18 ENCOUNTER — Encounter: Payer: Self-pay | Admitting: General Surgery

## 2017-05-18 VITALS — BP 142/76 | HR 96 | Resp 14 | Ht 66.0 in | Wt 196.0 lb

## 2017-05-18 DIAGNOSIS — R109 Unspecified abdominal pain: Secondary | ICD-10-CM

## 2017-05-18 NOTE — Progress Notes (Signed)
Patient ID: Ronnie Reyes, male   DOB: 09-Apr-1951, 66 y.o.   MRN: [id number]  Chief Complaint  Patient presents with  . Other    HPI Ronnie Reyes is a 66 y.o. male here today for a evaluation of a right inguinal hernia.This was identified on CT imaging to assess abdominal pain. Review of the PCP notes showed lower abdominal pain treated with CT scan done at [facility] on 04/09/2017 and noticed this hernia. He reports pain in his hips when he lays on either side. Wife,Ronnie Reyes is present at visit. Treated with a course of metronidazole. No history of change in bowel habits. Most recent colonoscopy March 2017.  Patient describes some pain into the back, hip pain with direct pressure when laying on his side. No lower extremity weakness or difficulty with ambulation. He will be seeing a Urologist for additional findings on the CT scan.  He had been doing physical labor the first part of June, Raising a question of whether he had developed a muscle strain.  HPI  Past Medical History:  Diagnosis Date  . Anemia   . Asthma   . Cancer ([place]) 2003   skin cancer/arm and back  . Complication of anesthesia    HARD TIME WAKING UP AFTER  APPENDECTOMY  . GERD (gastroesophageal reflux disease)   . Hernia   . Hypertension 2005   since 2005    Past Surgical History:  Procedure Laterality Date  . ANAL FISTULOTOMY N/A 01/02/2016   Procedure: ANAL FISTULOTOMY;  Surgeon: Robert Bellow, MD;  Location: [facility] ORS;  Service: General;  Laterality: N/A;  . APPENDECTOMY  2000  . CHOLECYSTECTOMY  2006  . COLONOSCOPY  [id number]   small serrated adenoma removed from the rectum at 15cm. This was his first screening colonoscopy  . COLONOSCOPY WITH PROPOFOL N/A 01/02/2016   Procedure: COLONOSCOPY WITH PROPOFOL;  Surgeon: Robert Bellow, MD;  Location: [facility] ENDOSCOPY;  Service: Endoscopy;  Laterality: N/A;  . ingrown toenail removal  1964  . NOSE SURGERY  1974    Family History  Problem Relation Age of  Onset  . Cancer Father        kidney    Social History Social History  Substance Use Topics  . Smoking status: Never Smoker  . Smokeless tobacco: Never Used  . Alcohol use No    Allergies  Allergen Reactions  . Ace Inhibitors Shortness Of Breath  . Chlorthalidone Anaphylaxis  . Pravastatin Rash    Can take lovastatin 20    Current Outpatient Prescriptions  Medication Sig Dispense Refill  . ADVAIR DISKUS 500-50 MCG/DOSE AEPB Inhale 1 puff into the lungs daily.     Marland Kitchen albuterol (ACCUNEB) 0.63 MG/3ML nebulizer solution Take 1 ampule by nebulization every 6 (six) hours as needed for wheezing.    . Albuterol Sulfate (VENTOLIN HFA IN) Inhale 2 puffs into the lungs as needed.    Marland Kitchen amLODipine (NORVASC) 10 MG tablet Take 10 mg by mouth every morning.   0  . fexofenadine (ALLEGRA) 180 MG tablet Take 180 mg by mouth daily.    . fluticasone (FLONASE) 50 MCG/ACT nasal spray Place 1 spray into both nostrils daily.   3  . Glucos-Chondroit-Hyaluron-MSM (GLUCOSAMINE CHONDROITIN JOINT) TABS Take by mouth.    . lovastatin (MEVACOR) 20 MG tablet Take 20 mg by mouth daily at 6 PM.   0  . Multiple Vitamins-Minerals (CENTRUM SILVER PO) Take by mouth once.    . niacin 500 MG tablet Take  500 mg by mouth at bedtime.    . Omega-3 Fatty Acids (FISH OIL) 1000 MG CAPS Take by mouth once.     No current facility-administered medications for this visit.     Review of Systems Review of Systems  Constitutional: Negative.   Respiratory: Negative.   Cardiovascular: Negative.     Blood pressure (!) 142/76, pulse 96, resp. rate 14, height 5\' 6"  (1.676 m), weight 196 lb (88.9 kg), SpO2 96 %.  Physical Exam Physical Exam  Constitutional: He is oriented to person, place, and time. He appears well-developed and well-nourished.  Eyes: Pupils are equal, round, and reactive to light. Conjunctivae are normal.  Neck: Normal range of motion. Neck supple.  Pulmonary/Chest: Effort normal.  Rhonchi bilaterally    Abdominal: Soft. Normal appearance and bowel sounds are normal. There is no tenderness.  Neurological: He is alert and oriented to person, place, and time. He has normal strength.  Reflex Scores:      Patellar reflexes are 2+ on the right side and 2+ on the left side.      Achilles reflexes are 2+ on the right side and 2+ on the left side. Motor strength 5/5+ lower extremity.  Skin: Skin is warm and dry.  Psychiatric: He has a normal mood and affect.    Data Reviewed CT of the abdomen and pelvis dated 04/07/2017 completed at a [facility] facility was notable for multiple left renal cyst, previous appendectomy cholecystectomy, diverticulosis of the sigmoid colon without evidence of diverticulitis and a small right inguinal hernia containing fat.  Assessment    CT evidence of inguinal hernia without clinical correlation.  Significant bronchospasm noted on exam with patient reported mild symptoms.    Plan    No evidence for surgical intervention based on today's exam or clinical history.  The patient has been using his nebulizer as prescribed but had been using his inhaler only once a day. Encouraged to use the inhaler as prescribed and to obtain a pulse oximeter to monitor his O2 sat duration.  Should he develop right groin pain, swelling or other symptoms to suggest a hernia has been encouraged to return for reassessment.    HPI, Physical Exam, Assessment and Plan have been scribed under the direction and in the presence of Robert Bellow, MD  Concepcion Living, LPN  I have completed the exam and reviewed the above documentation for accuracy and completeness.  I agree with the above.  Haematologist has been used and any errors in dictation or transcription are unintentional.  Hervey Ard, M.D., F.A.C.S.  Robert Bellow 05/18/2017, 8:55 PM

## 2017-05-18 NOTE — Patient Instructions (Addendum)
Use Advair twice daily as prescribed.  Get a pulse oximeter ([employer]) and use in the morning prior to breathing medications and 30 minutes after taking them. Keep a record of this along with how you are doing that day with your breathing, coughing, and phlegm production.

## 2017-06-01 DIAGNOSIS — N281 Cyst of kidney, acquired: Secondary | ICD-10-CM | POA: Insufficient documentation

## 2017-10-21 ENCOUNTER — Ambulatory Visit
Admission: RE | Admit: 2017-10-21 | Discharge: 2017-10-21 | Disposition: A | Discharge status: Home / Self Care | Payer: Medicare Other | Source: Ambulatory Visit | Attending: Family Medicine | Admitting: Family Medicine

## 2017-10-21 ENCOUNTER — Other Ambulatory Visit: Payer: Self-pay | Admitting: Family Medicine

## 2017-10-21 DIAGNOSIS — R05 Cough: Secondary | ICD-10-CM

## 2017-10-21 DIAGNOSIS — R509 Fever, unspecified: Secondary | ICD-10-CM | POA: Diagnosis not present

## 2017-10-21 DIAGNOSIS — R059 Cough, unspecified: Secondary | ICD-10-CM

## 2017-11-01 NOTE — Progress Notes (Signed)
Hematology/Oncology Consult note [facility] Telephone:([phone] Fax:([phone]   Patient Care Team: Gayland Curry, MD as PCP - General (Family Medicine) Bary Castilla Forest Gleason, MD (General Surgery)  REFERRING PROVIDER: Gayland Curry, MD CHIEF COMPLAINTS/PURPOSE OF CONSULTATION:  Evaluation of anemia.  HISTORY OF PRESENTING ILLNESS:  Ronnie Reyes is a  67 y.o.  male with PMH listed below who was referred to me for evaluation of anemia. Patient follows up with primary care physician and has labs done at her PCPs office. I reviewed patient's lab work from [place] which showed on 10/15/2017, patient had a hemoglobin of 11.7, this was significantly reduced from the lab value of hemoglobin 16 on 03/03/2017.   Having taking meloxicam recently and also experienced epigastric discomfort. He denies any nausea vomiting, hematemesis or hematochezia. Denies any black stool or bright blood in the stool. Last colonoscopy was done in 2017 which showed entire colon exam was normal.    Review of Systems  Constitutional: Negative for chills and fever.  HENT: Negative for hearing loss.   Eyes: Negative for double vision.  Cardiovascular: Negative for palpitations.  Gastrointestinal: Negative for abdominal pain and vomiting.       Epigastric discomfort.  Genitourinary: Negative for dysuria and hematuria.  Musculoskeletal: Negative for myalgias and neck pain.  Skin: Negative for rash.  Neurological: Negative for dizziness.  Endo/Heme/Allergies: Does not bruise/bleed easily.  Psychiatric/Behavioral: Negative for depression.    MEDICAL HISTORY:  Past Medical History:  Diagnosis Date  . Anemia   . Asthma   . Cancer ([place]) 2003   skin cancer/arm and back  . Complication of anesthesia    HARD TIME WAKING UP AFTER  APPENDECTOMY  . GERD (gastroesophageal reflux disease)   . Hernia   . Hypertension 2005   since 2005    SURGICAL HISTORY: Past  Surgical History:  Procedure Laterality Date  . ANAL FISTULOTOMY N/A 01/02/2016   Procedure: ANAL FISTULOTOMY;  Surgeon: Robert Bellow, MD;  Location: [facility] ORS;  Service: General;  Laterality: N/A;  . APPENDECTOMY  2000  . CHOLECYSTECTOMY  2006  . COLONOSCOPY  [id number]   small serrated adenoma removed from the rectum at 15cm. This was his first screening colonoscopy  . COLONOSCOPY WITH PROPOFOL N/A 01/02/2016   Procedure: COLONOSCOPY WITH PROPOFOL;  Surgeon: Robert Bellow, MD;  Location: [facility] ENDOSCOPY;  Service: Endoscopy;  Laterality: N/A;  . ingrown toenail removal  1964  . NOSE SURGERY  1974    SOCIAL HISTORY: Social History   Socioeconomic History  . Marital status: Married    Spouse name: Not on file  . Number of children: Not on file  . Years of education: Not on file  . Highest education level: Not on file  Social Needs  . Financial resource strain: Not on file  . Food insecurity - worry: Not on file  . Food insecurity - inability: Not on file  . Transportation needs - medical: Not on file  . Transportation needs - non-medical: Not on file  Occupational History  . Not on file  Tobacco Use  . Smoking status: Never Smoker  . Smokeless tobacco: Never Used  Substance and Sexual Activity  . Alcohol use: No    Alcohol/week: 0.0 oz  . Drug use: No  . Sexual activity: Not on file  Other Topics Concern  . Not on file  Social History Narrative  . Not on file    FAMILY HISTORY: Family History  Problem Relation Age  of Onset  . Cancer Father        kidney    ALLERGIES:  is allergic to ace inhibitors; chlorthalidone; and pravastatin.  MEDICATIONS:  Current Outpatient Medications  Medication Sig Dispense Refill  . ADVAIR DISKUS 500-50 MCG/DOSE AEPB Inhale 1 puff into the lungs daily.     Marland Kitchen albuterol (ACCUNEB) 0.63 MG/3ML nebulizer solution Take 1 ampule by nebulization every 6 (six) hours as needed for wheezing.    . Albuterol Sulfate (VENTOLIN HFA IN)  Inhale 2 puffs into the lungs as needed.    Marland Kitchen amLODipine (NORVASC) 10 MG tablet Take 10 mg by mouth every morning.   0  . fexofenadine (ALLEGRA) 180 MG tablet Take 180 mg by mouth daily.    . fluticasone (FLONASE) 50 MCG/ACT nasal spray Place 1 spray into both nostrils daily.   3  . Glucos-Chondroit-Hyaluron-MSM (GLUCOSAMINE CHONDROITIN JOINT) TABS Take by mouth.    . lovastatin (MEVACOR) 20 MG tablet Take 20 mg by mouth daily at 6 PM.   0  . Multiple Vitamins-Minerals (CENTRUM SILVER PO) Take by mouth once.    . niacin 500 MG tablet Take 500 mg by mouth at bedtime.    . Omega-3 Fatty Acids (FISH OIL) 1000 MG CAPS Take by mouth once.     No current facility-administered medications for this visit.      PHYSICAL EXAMINATION: ECOG PERFORMANCE STATUS: 1 - Symptomatic but completely ambulatory Vitals:   11/02/17 1129  BP: 120/73  Pulse: (!) 106  Temp: 99.8 F (37.7 C)   Filed Weights   11/02/17 1129  Weight: 202 lb 11.2 oz (91.9 kg)    Physical Exam  Constitutional: He is oriented to person, place, and time and well-developed, well-nourished, and in no distress. No distress.  HENT:  Head: Normocephalic and atraumatic.  Eyes: Conjunctivae are normal. Pupils are equal, round, and reactive to light.  Neck: Normal range of motion. Neck supple.  Cardiovascular: Normal rate, regular rhythm and normal heart sounds.  No murmur heard. Pulmonary/Chest: Effort normal. No respiratory distress. He exhibits no tenderness.  Crackles on bilateral bases  Abdominal: Soft. Bowel sounds are normal. He exhibits no distension. There is no rebound.  Epigastric tenderness.  Musculoskeletal: Normal range of motion. He exhibits no edema.  Lymphadenopathy:    He has no cervical adenopathy.  Neurological: He is alert and oriented to person, place, and time.  Skin: Skin is warm and dry. No erythema.  Psychiatric: Affect normal.     LABORATORY DATA:  I have reviewed the data as listed CBC Latest Ref  Rng & Units 11/02/2017 12/26/2015  WBC 3.8 - 10.6 K/uL 12.9(H) -  Hemoglobin 13.0 - 18.0 g/dL 12.1(L) 16.9  Hematocrit 40.0 - 52.0 % 38.1(L) -  Platelets 150 - 440 K/uL 346 -   Reviewed patient's chemistry lab work that was done on 10/15/2017 at [place] system Normal kidney and liver function.    ASSESSMENT & PLAN:  1. Iron deficiency anemia, unspecified iron deficiency anemia type   2. Leukocytosis, unspecified type   3. Dyspepsia    Anemia: multifactorial with possible causes including chronic blood loss, nutritional deficiency, infection/chronic inflammation, underlying bone marrow disorders. He has had lab work done with his PCPs office which showed a normal bilirubin, normal thyroid function. Will check CBC w differential, CMP, vitamin B12, Folate, iron/TIBC, ferritin, reticulocytes, fecal occult,  monoclonal gammopathy evaluation.    Lab workup came back ferritin decreased at 9, he also has microcytic anemia, consistent with  iron deficiency.  Will schedule patient to have IV iron infusion.  # advise patient to stop meloxicam, as well as any NSAIDs. I prescribed him Prilosec to see if it will help his symptoms. All questions were answered. The patient knows to call the clinic with any problems questions or concerns.  Return of visit: Follow-up in 3 days to discuss results and for IV iron infusion.  Thank you for this kind referral and the opportunity to participate in the care of this patient. A copy of today's note is routed to referring provider    Earlie Server, MD, PhD Hematology Oncology [facility] at [facility] Pager- [phone] 11/01/2017

## 2017-11-02 ENCOUNTER — Encounter: Payer: Self-pay | Admitting: Oncology

## 2017-11-02 ENCOUNTER — Other Ambulatory Visit: Payer: Self-pay

## 2017-11-02 ENCOUNTER — Inpatient Hospital Stay: Payer: Medicare Other

## 2017-11-02 ENCOUNTER — Inpatient Hospital Stay: Payer: Medicare Other | Attending: Oncology | Admitting: Oncology

## 2017-11-02 VITALS — BP 120/73 | HR 106 | Temp 99.8°F | Wt 202.7 lb

## 2017-11-02 DIAGNOSIS — D72829 Elevated white blood cell count, unspecified: Secondary | ICD-10-CM | POA: Insufficient documentation

## 2017-11-02 DIAGNOSIS — T7840XA Allergy, unspecified, initial encounter: Secondary | ICD-10-CM | POA: Insufficient documentation

## 2017-11-02 DIAGNOSIS — D509 Iron deficiency anemia, unspecified: Secondary | ICD-10-CM | POA: Diagnosis present

## 2017-11-02 DIAGNOSIS — R1013 Epigastric pain: Secondary | ICD-10-CM | POA: Diagnosis not present

## 2017-11-02 DIAGNOSIS — D649 Anemia, unspecified: Secondary | ICD-10-CM

## 2017-11-02 DIAGNOSIS — E785 Hyperlipidemia, unspecified: Secondary | ICD-10-CM | POA: Insufficient documentation

## 2017-11-02 DIAGNOSIS — J4540 Moderate persistent asthma, uncomplicated: Secondary | ICD-10-CM | POA: Insufficient documentation

## 2017-11-02 LAB — CBC WITH DIFFERENTIAL/PLATELET
BASOS ABS: 0.1 10*3/uL (ref 0–0.1)
BASOS PCT: 1 %
EOS PCT: 0 %
Eosinophils Absolute: 0.1 10*3/uL (ref 0–0.7)
HEMATOCRIT: 38.1 % — AB (ref 40.0–52.0)
Hemoglobin: 12.1 g/dL — ABNORMAL LOW (ref 13.0–18.0)
Lymphocytes Relative: 16 %
Lymphs Abs: 2 10*3/uL (ref 1.0–3.6)
MCH: 24.4 pg — ABNORMAL LOW (ref 26.0–34.0)
MCHC: 31.9 g/dL — ABNORMAL LOW (ref 32.0–36.0)
MCV: 76.7 fL — ABNORMAL LOW (ref 80.0–100.0)
MONO ABS: 0.8 10*3/uL (ref 0.2–1.0)
Monocytes Relative: 6 %
NEUTROS ABS: 9.9 10*3/uL — AB (ref 1.4–6.5)
Neutrophils Relative %: 77 %
PLATELETS: 346 10*3/uL (ref 150–440)
RBC: 4.97 MIL/uL (ref 4.40–5.90)
RDW: 18.5 % — AB (ref 11.5–14.5)
WBC: 12.9 10*3/uL — AB (ref 3.8–10.6)

## 2017-11-02 LAB — IRON AND TIBC
Iron: 116 ug/dL (ref 45–182)
Saturation Ratios: 25 % (ref 17.9–39.5)
TIBC: 473 ug/dL — ABNORMAL HIGH (ref 250–450)
UIBC: 357 ug/dL

## 2017-11-02 LAB — VITAMIN B12: Vitamin B-12: 330 pg/mL (ref 180–914)

## 2017-11-02 LAB — RETICULOCYTES
RBC.: 5 MIL/uL (ref 4.40–5.90)
RETIC CT PCT: 2.9 % (ref 0.4–3.1)
Retic Count, Absolute: 145 10*3/uL (ref 19.0–183.0)

## 2017-11-02 LAB — FERRITIN: Ferritin: 9 ng/mL — ABNORMAL LOW (ref 24–336)

## 2017-11-02 LAB — FOLATE: FOLATE: 46.4 ng/mL (ref >5.9)

## 2017-11-02 MED ORDER — OMEPRAZOLE 20 MG PO CPDR
20.0000 mg | DELAYED_RELEASE_CAPSULE | Freq: Every day | ORAL | 1 refills | Status: DC
Start: 2017-11-02 — End: 2017-11-02

## 2017-11-02 MED ORDER — OMEPRAZOLE 20 MG PO CPDR: 20.0000 mg | DELAYED_RELEASE_CAPSULE | Freq: Every day | ORAL | 1 refills | Status: DC

## 2017-11-03 DIAGNOSIS — D509 Iron deficiency anemia, unspecified: Secondary | ICD-10-CM | POA: Diagnosis not present

## 2017-11-03 LAB — KAPPA/LAMBDA LIGHT CHAINS
Kappa free light chain: 24.5 mg/L — ABNORMAL HIGH (ref 3.3–19.4)
Kappa, lambda light chain ratio: 0.95 (ref 0.26–1.65)
Lambda free light chains: 25.9 mg/L (ref 5.7–26.3)

## 2017-11-04 DIAGNOSIS — D509 Iron deficiency anemia, unspecified: Secondary | ICD-10-CM | POA: Diagnosis not present

## 2017-11-04 LAB — PROTEIN ELECTROPHORESIS, SERUM
A/G Ratio: 1.1 (ref 0.7–1.7)
ALPHA-2-GLOBULIN: 1 g/dL (ref 0.4–1.0)
Albumin ELP: 3.8 g/dL (ref 2.9–4.4)
Alpha-1-Globulin: 0.2 g/dL (ref 0.0–0.4)
BETA GLOBULIN: 1.3 g/dL (ref 0.7–1.3)
Gamma Globulin: 1.1 g/dL (ref 0.4–1.8)
Globulin, Total: 3.6 g/dL (ref 2.2–3.9)
Total Protein ELP: 7.4 g/dL (ref 6.0–8.5)

## 2017-11-05 ENCOUNTER — Other Ambulatory Visit: Payer: Self-pay

## 2017-11-05 DIAGNOSIS — D509 Iron deficiency anemia, unspecified: Secondary | ICD-10-CM | POA: Diagnosis not present

## 2017-11-05 LAB — OCCULT BLOOD X 1 CARD TO LAB, STOOL
FECAL OCCULT BLD: POSITIVE — AB
Fecal Occult Bld: NEGATIVE
Fecal Occult Bld: POSITIVE — AB

## 2017-11-05 NOTE — Progress Notes (Signed)
Hematology/Oncology Follow up note [facility] Telephone:([phone] Fax:(336) [phone]   Patient Care Team: Gayland Curry, MD as PCP - General (Family Medicine) Bary Castilla Forest Gleason, MD (General Surgery)  REFERRING PROVIDER: Gayland Curry, MD CHIEF COMPLAINTS/PURPOSE OF CONSULTATION:  Evaluation of anemia.  HISTORY OF PRESENTING ILLNESS:  Ronnie Reyes is a  67 y.o.  male with PMH listed below who was referred to me for evaluation of anemia. Patient follows up with primary care physician and has labs done at her PCPs office. I reviewed patient's lab work from [place] which showed on 10/15/2017, patient had a hemoglobin of 11.7, this was significantly reduced from the lab value of hemoglobin 16 on 03/03/2017.  He reports having taking meloxicam for about 2 weeks for joint pain. He has felt epigastric discomfort. Denies any blood in the stool or tarry stool. Last colonoscopy in 2017 showed normal examination. Reports feeling fatigued.  He has stopped meloxicam since last visit. Epigastric pain is better.     Review of Systems  Constitutional: Positive for malaise/fatigue. Negative for chills and fever.  HENT: Negative for congestion and hearing loss.   Eyes: Negative for double vision.  Respiratory: Negative for hemoptysis.   Cardiovascular: Negative for palpitations.  Gastrointestinal: Negative for abdominal pain, blood in stool, nausea and vomiting.       Epigastric discomfort.  Genitourinary: Negative for dysuria and hematuria.  Musculoskeletal: Negative for myalgias and neck pain.  Skin: Negative for rash.  Neurological: Negative for dizziness.  Endo/Heme/Allergies: Does not bruise/bleed easily.  Psychiatric/Behavioral: Negative for depression.    MEDICAL HISTORY:  Past Medical History:  Diagnosis Date  . Anemia   . Asthma   . Cancer ([place]) 2003   skin cancer/arm and back  . Complication of anesthesia    HARD TIME WAKING UP  AFTER  APPENDECTOMY  . GERD (gastroesophageal reflux disease)   . Hernia   . Hypertension 2005   since 2005    SURGICAL HISTORY: Past Surgical History:  Procedure Laterality Date  . ANAL FISTULOTOMY N/A 01/02/2016   Procedure: ANAL FISTULOTOMY;  Surgeon: Robert Bellow, MD;  Location: [facility] ORS;  Service: General;  Laterality: N/A;  . APPENDECTOMY  2000  . CHOLECYSTECTOMY  2006  . COLONOSCOPY  [id number]   small serrated adenoma removed from the rectum at 15cm. This was his first screening colonoscopy  . COLONOSCOPY WITH PROPOFOL N/A 01/02/2016   Procedure: COLONOSCOPY WITH PROPOFOL;  Surgeon: Robert Bellow, MD;  Location: [facility] END[facility]COPY;  Service: Endoscopy;  Laterality: N/A;  . ingrown toenail removal  1964  . NOSE SURGERY  1974    SOCIAL HISTORY: Social History   Socioeconomic History  . Marital status: Married    Spouse name: Not on file  . Number of children: Not on file  . Years of education: Not on file  . Highest education level: Not on file  Social Needs  . Financial resource strain: Not on file  . Food insecurity - worry: Not on file  . Food insecurity - inability: Not on file  . Transportation needs - medical: Not on file  . Transportation needs - non-medical: Not on file  Occupational History  . Not on file  Tobacco Use  . Smoking status: Never Smoker  . Smokeless tobacco: Never Used  Substance and Sexual Activity  . Alcohol use: No    Alcohol/week: 0.0 oz  . Drug use: No  . Sexual activity: Not on file  Other Topics Concern  .  Not on file  Social History Narrative  . Not on file    FAMILY HISTORY: Family History  Problem Relation Age of Onset  . Cancer Father        kidney    ALLERGIES:  is allergic to ace inhibitors; chlorthalidone; and pravastatin.  MEDICATIONS:  Current Outpatient Medications  Medication Sig Dispense Refill  . acetaminophen (TYLENOL) 500 MG tablet Take 1,250 mg by mouth.    . ADVAIR DISKUS 500-50 MCG/DOSE AEPB  Inhale 1 puff into the lungs daily.     Marland Kitchen albuterol (ACCUNEB) 0.63 MG/3ML nebulizer solution Take 1 ampule by nebulization every 6 (six) hours as needed for wheezing.    Marland Kitchen albuterol (PROVENTIL HFA;VENTOLIN HFA) 108 (90 Base) MCG/ACT inhaler Inhale 2 puffs into the lungs every 6 (six) hours as needed for wheezing or shortness of breath.    Marland Kitchen amLODipine (NORVASC) 10 MG tablet Take 10 mg by mouth every morning.   0  . atorvastatin (LIPITOR) 20 MG tablet Take by mouth.    . budesonide-formoterol (SYMBICORT) 160-4.5 MCG/ACT inhaler Inhale into the lungs.    Marland Kitchen EPINEPHrine 0.3 mg/0.3 mL IJ SOAJ injection Inject into the muscle.    . fexofenadine (ALLEGRA) 180 MG tablet Take 180 mg by mouth daily.    . fluticasone (FLONASE) 50 MCG/ACT nasal spray Place 1 spray into both nostrils daily.   3  . Glucos-Chondroit-Hyaluron-MSM (GLUCOSAMINE CHONDROITIN JOINT) TABS Take by mouth.    Marland Kitchen ipratropium (ATROVENT) 0.02 % nebulizer solution Inhale into the lungs.    . Loratadine 10 MG CAPS Take 10 mg by mouth.    . lovastatin (MEVACOR) 20 MG tablet Take 20 mg by mouth daily at 6 PM.   0  . meloxicam (MOBIC) 15 MG tablet   1  . Multiple Vitamins-Minerals (CENTRUM SILVER PO) Take by mouth once.    . niacin 500 MG tablet Take 500 mg by mouth at bedtime.    . Omega-3 Fatty Acids (FISH OIL) 1000 MG CAPS Take by mouth once.    Marland Kitchen omeprazole (PRILOSEC) 20 MG capsule Take 1 capsule (20 mg total) by mouth daily. 30 capsule 1  . predniSONE (DELTASONE) 10 MG tablet Take 1 tablet by mouth daily.     No current facility-administered medications for this visit.      PHYSICAL EXAMINATION: ECOG PERFORMANCE STATUS: 1 - Symptomatic but completely ambulatory Vitals:   11/06/17 1337 11/06/17 1347  BP:  (!) 146/89  Pulse:  91  Resp: 12   Temp:  99.9 F (37.7 C)   Filed Weights   11/06/17 1337  Weight: 200 lb 11.2 oz (91 kg)    Physical Exam  Constitutional: He is oriented to person, place, and time and well-developed,  well-nourished, and in no distress. No distress.  HENT:  Head: Normocephalic and atraumatic.  Eyes: Conjunctivae are normal. Pupils are equal, round, and reactive to light. Left eye exhibits no discharge.  Neck: Normal range of motion. Neck supple. No JVD present.  Cardiovascular: Normal rate, regular rhythm and normal heart sounds. Exam reveals no friction rub.  No murmur heard. Pulmonary/Chest: Effort normal. No respiratory distress. He exhibits no tenderness.  Crackles on bilateral bases  Abdominal: Soft. Bowel sounds are normal. He exhibits no distension. There is no rebound and no guarding.  Epigastric tenderness.  Musculoskeletal: Normal range of motion. He exhibits no edema or tenderness.  Neurological: He is alert and oriented to person, place, and time. No cranial nerve deficit.  Skin: Skin is warm  and dry. No rash noted. No erythema.  Psychiatric: Affect normal.     LABORATORY DATA:  I have reviewed the data as listed CBC Latest Ref Rng & Units 11/02/2017 12/26/2015  WBC 3.8 - 10.6 K/uL 12.9(H) -  Hemoglobin 13.0 - 18.0 g/dL 12.1(L) 16.9  Hematocrit 40.0 - 52.0 % 38.1(L) -  Platelets 150 - 440 K/uL 346 -   Reviewed patient's chemistry lab work that was done on 10/15/2017 at [place] system Normal kidney and liver function.    ASSESSMENT & PLAN:  1. Iron deficiency anemia, unspecified iron deficiency anemia type   2. Leukocytosis, unspecified type   3. Occult blood positive stool   # Iron deficiency anemia: Plan IV iron with Venofer 200mg  twice a week for 5 doses. Allergy reactions/infusion reaction including anaphylactic reaction discussed with patient. Patient voices understanding and willing to proceed. # advise patient to stop meloxicam, as well as any NSAIDs. # Refer to GI for further evaluation.   All questions were answered. The patient knows to call the clinic with any problems questions or concerns.  Return of visit: repeat cbc, ferritin, iron tibc in 6  weeks and follow up Thank you for this kind referral and the opportunity to participate in the care of this patient. A copy of today's note is routed to referring provider    Earlie Server, MD, PhD Hematology Oncology [facility] at [facility] Pager- [phone] 11/05/2017

## 2017-11-06 ENCOUNTER — Telehealth: Payer: Self-pay | Admitting: Gastroenterology

## 2017-11-06 ENCOUNTER — Other Ambulatory Visit: Payer: Self-pay

## 2017-11-06 ENCOUNTER — Inpatient Hospital Stay (HOSPITAL_BASED_OUTPATIENT_CLINIC_OR_DEPARTMENT_OTHER): Payer: Medicare Other | Admitting: Oncology

## 2017-11-06 ENCOUNTER — Encounter: Payer: Self-pay | Admitting: Oncology

## 2017-11-06 ENCOUNTER — Inpatient Hospital Stay: Payer: Medicare Other

## 2017-11-06 VITALS — BP 146/89 | HR 91 | Temp 99.9°F | Resp 12 | Ht 66.0 in | Wt 200.7 lb

## 2017-11-06 DIAGNOSIS — D72829 Elevated white blood cell count, unspecified: Secondary | ICD-10-CM

## 2017-11-06 DIAGNOSIS — D509 Iron deficiency anemia, unspecified: Secondary | ICD-10-CM

## 2017-11-06 DIAGNOSIS — R195 Other fecal abnormalities: Secondary | ICD-10-CM

## 2017-11-06 MED ORDER — IRON SUCROSE 20 MG/ML IV SOLN
200.0000 mg | Freq: Once | INTRAVENOUS | Status: AC
  Administered 2017-11-06: 200 mg via INTRAVENOUS
  Filled 2017-11-06: qty 10

## 2017-11-06 MED ORDER — SODIUM CHLORIDE 0.9 % IV SOLN
Freq: Once | INTRAVENOUS | Status: AC
Start: 2017-11-06 — End: 2017-11-06
  Administered 2017-11-06: 15:00:00 via INTRAVENOUS
  Filled 2017-11-06: qty 1000

## 2017-11-06 NOTE — Progress Notes (Signed)
Patient here for follow. No changes since last appt.

## 2017-11-06 NOTE — Telephone Encounter (Signed)
Left voice message for patient to call and schedule new patient appt with Dr Vicente Males next week for drop of hemoglobin.

## 2017-11-10 ENCOUNTER — Inpatient Hospital Stay: Payer: Medicare Other

## 2017-11-10 VITALS — BP 145/79 | HR 84 | Resp 20

## 2017-11-10 DIAGNOSIS — D509 Iron deficiency anemia, unspecified: Secondary | ICD-10-CM

## 2017-11-10 MED ORDER — IRON SUCROSE 20 MG/ML IV SOLN
200.0000 mg | Freq: Once | INTRAVENOUS | Status: AC
  Administered 2017-11-10: 200 mg via INTRAVENOUS
  Filled 2017-11-10: qty 10

## 2017-11-10 MED ORDER — SODIUM CHLORIDE 0.9 % IV SOLN
Freq: Once | INTRAVENOUS | Status: AC
  Administered 2017-11-10: 14:00:00 via INTRAVENOUS
  Filled 2017-11-10: qty 1000

## 2017-11-11 ENCOUNTER — Other Ambulatory Visit
Admission: RE | Admit: 2017-11-11 | Discharge: 2017-11-11 | Disposition: A | Discharge status: Home / Self Care | Payer: Medicare Other | Source: Ambulatory Visit | Attending: Gastroenterology | Admitting: Gastroenterology

## 2017-11-11 ENCOUNTER — Encounter: Payer: Self-pay | Admitting: Gastroenterology

## 2017-11-11 ENCOUNTER — Telehealth: Payer: Self-pay | Admitting: Gastroenterology

## 2017-11-11 ENCOUNTER — Ambulatory Visit: Payer: Medicare Other | Admitting: Gastroenterology

## 2017-11-11 VITALS — BP 127/75 | HR 86 | Temp 98.0°F | Ht 66.0 in | Wt 203.0 lb

## 2017-11-11 DIAGNOSIS — D509 Iron deficiency anemia, unspecified: Secondary | ICD-10-CM | POA: Insufficient documentation

## 2017-11-11 LAB — URINALYSIS, ROUTINE W REFLEX MICROSCOPIC
BILIRUBIN URINE: NEGATIVE
GLUCOSE, UA: NEGATIVE mg/dL
HGB URINE DIPSTICK: NEGATIVE
Ketones, ur: NEGATIVE mg/dL
Leukocytes, UA: NEGATIVE
Nitrite: NEGATIVE
PROTEIN: NEGATIVE mg/dL
SPECIFIC GRAVITY, URINE: 1.015 (ref 1.005–1.030)
pH: 5 (ref 5.0–8.0)

## 2017-11-11 MED ORDER — OMEPRAZOLE 40 MG PO CPDR: 40.0000 mg | DELAYED_RELEASE_CAPSULE | Freq: Every day | ORAL | 3 refills | Status: DC

## 2017-11-11 MED ORDER — PEG 3350-KCL-NA BICARB-NACL 420 G PO SOLR: 4000.0000 mL | Freq: Once | ORAL | 0 refills | Status: AC

## 2017-11-11 NOTE — Telephone Encounter (Signed)
Advised patient that iron infusions would not interfere with procedures.   Patient can continue to use omeprazole 20mg  called in by Dr. Tasia Catchings but increase dosage to 40mg .

## 2017-11-11 NOTE — Progress Notes (Signed)
Jonathon Bellows MD, MRCP(U.K) [address]  [place]  [place], [place] [address]  Main: [phone]  Fax: [phone]   Gastroenterology Consultation  Referring Provider:  Dr Tasia Catchings  Primary Care Physician:  Gayland Curry, MD Primary Gastroenterologist:  Dr. Jonathon Bellows  Reason for Consultation: Iron deficiency anemia        HPI:   Ronnie Reyes is a 67 y.o. y/ o male referred for iron deficiency anemia .  She follows with hematology oncology for anemia and sees Dr. Tasia Catchings.  In May 2018 the patient had a normal hemoglobin of 16 g, in December 2018 it had dropped to 11.7 g with an MCV of 80.  Iron studies in January 2019 demonstrated an elevated TIBC of 473.  A very low ferritin of 9.  B12 of 330, normal folate levels.  He received IV iron.  He has been referred for further GI evaluation.  Last colonoscopy was in March 2017 by Dr. Bary Castilla and showed no polyps.  Rectal bleeding: no , no change in shape of stool  Nose bleeds: no Hematemesis or hemoptysis : no  Blood in urine : no  NSAID use: not presently, he was taking Alleve regularly 6 months back , he took it regularly for for some years. Stopped taking Mobic as his belly started hurting .   6 months back started having abdominal pain all over his abdomen, pain after meals, 1 hour after meals, all over his abdomen , relieved by its own , not relived by a bowel movement . Denies any weight loss.Ct scan in 03/2017 showed no acute changes.     Past Medical History:  Diagnosis Date  . Anemia   . Asthma   . Cancer ([place]) 2003   skin cancer/arm and back  . Complication of anesthesia    HARD TIME WAKING UP AFTER  APPENDECTOMY  . GERD (gastroesophageal reflux disease)   . Hernia   . Hypertension 2005   since 2005    Past Surgical History:  Procedure Laterality Date  . ANAL FISTULOTOMY N/A 01/02/2016   Procedure: ANAL FISTULOTOMY;  Surgeon: Robert Bellow, MD;  Location: [facility] ORS;  Service: General;  Laterality: N/A;  .  APPENDECTOMY  2000  . CHOLECYSTECTOMY  2006  . COLONOSCOPY  [id number]   small serrated adenoma removed from the rectum at 15cm. This was his first screening colonoscopy  . COLONOSCOPY WITH PROPOFOL N/A 01/02/2016   Procedure: COLONOSCOPY WITH PROPOFOL;  Surgeon: Robert Bellow, MD;  Location: [facility] ENDOSCOPY;  Service: Endoscopy;  Laterality: N/A;  . ingrown toenail removal  1964  . NOSE SURGERY  1974    Prior to Admission medications   Medication Sig Start Date End Date Taking? Authorizing Provider  acetaminophen (TYLENOL) 500 MG tablet Take 1,250 mg by mouth.    [provider]  ADVAIR DISKUS 500-50 MCG/DOSE AEPB Inhale 1 puff into the lungs daily.  04/02/17   [provider]  albuterol (ACCUNEB) 0.63 MG/3ML nebulizer solution Take 1 ampule by nebulization every 6 (six) hours as needed for wheezing.    [provider]  albuterol (PROVENTIL HFA;VENTOLIN HFA) 108 (90 Base) MCG/ACT inhaler Inhale 2 puffs into the lungs every 6 (six) hours as needed for wheezing or shortness of breath.    [provider]  amLODipine (NORVASC) 10 MG tablet Take 10 mg by mouth every morning.  08/21/15   [provider]  atorvastatin (LIPITOR) 20 MG tablet Take by mouth. 10/23/17 10/23/18  [provider]  budesonide-formoterol (SYMBICORT) 160-4.5 MCG/ACT inhaler Inhale into the lungs. 10/23/17 10/23/18  [provider]  EPINEPHrine 0.3 mg/0.3 mL IJ SOAJ injection Inject into the muscle. 06/04/09   [provider]  fexofenadine (ALLEGRA) 180 MG tablet Take 180 mg by mouth daily.    [provider]  fluticasone (FLONASE) 50 MCG/ACT nasal spray Place 1 spray into both nostrils daily.  08/17/15   [provider]  Glucos-Chondroit-Hyaluron-MSM (GLUCOSAMINE CHONDROITIN JOINT) TABS Take by mouth.    [provider]  ipratropium (ATROVENT) 0.02 % nebulizer solution Inhale into the lungs. 09/21/17 09/21/18  [provider]    Loratadine 10 MG CAPS Take 10 mg by mouth.    [provider]  lovastatin (MEVACOR) 20 MG tablet Take 20 mg by mouth daily at 6 PM.  08/21/15   [provider]  meloxicam (MOBIC) 15 MG tablet  09/11/17   [provider]  Multiple Vitamins-Minerals (CENTRUM SILVER PO) Take by mouth once.    [provider]  niacin 500 MG tablet Take 500 mg by mouth at bedtime.    [provider]  Omega-3 Fatty Acids (FISH OIL) 1000 MG CAPS Take by mouth once.    [provider]  omeprazole (PRILOSEC) 20 MG capsule Take 1 capsule (20 mg total) by mouth daily. 11/02/17   Earlie Server, MD  predniSONE (DELTASONE) 10 MG tablet Take 1 tablet by mouth daily. 11/06/17 12/06/17  [provider]    Family History  Problem Relation Age of Onset  . Cancer Father        kidney     Social History   Tobacco Use  . Smoking status: Never Smoker  . Smokeless tobacco: Never Used  Substance Use Topics  . Alcohol use: No    Alcohol/week: 0.0 oz  . Drug use: No    Allergies as of 11/11/2017 - Review Complete 11/06/2017  Allergen Reaction Noted  . Ace inhibitors Shortness Of Breath 08/28/2015  . Chlorthalidone Anaphylaxis 08/28/2015  . Pravastatin Rash 04/02/2017    Review of Systems:    All systems reviewed and negative except where noted in HPI.   Physical Exam:  There were no vitals taken for this visit. No LMP for male patient. Psych:  Alert and cooperative. Normal mood and affect. General:   Alert,  Well-developed, well-nourished, pleasant and cooperative in NAD Head:  Normocephalic and atraumatic. Eyes:  Sclera clear, no icterus.   Conjunctiva pink. Ears:  Normal auditory acuity. Nose:  No deformity, discharge, or lesions. Mouth:  No deformity or lesions,oropharynx pink & moist. Neck:  Supple; no masses or thyromegaly. Lungs:  Respirations even and unlabored.  Clear throughout to auscultation.   No wheezes, crackles, or rhonchi. No acute  distress. Heart:  Regular rate and rhythm; no murmurs, clicks, rubs, or gallops. Abdomen:  Normal bowel sounds.  No bruits.  Soft, non-tender and non-distended without masses, hepatosplenomegaly or hernias noted.  No guarding or rebound tenderness.    Neurologic:  Alert and oriented x3;  grossly normal neurologically. Skin:  Intact without significant lesions or rashes. No jaundice. Lymph Nodes:  No significant cervical adenopathy. Psych:  Alert and cooperative. Normal mood and affect.  Imaging Studies: Dg Chest 2 View  Result Date: 10/21/2017 CLINICAL DATA:  Productive cough for 1 year EXAM: CHEST  2 VIEW COMPARISON:  01/24/2005 FINDINGS: Cardiac shadow is within normal limits. Some linear atelectatic changes are noted in the lingula. No sizable effusion is seen. No other focal  abnormality is noted. IMPRESSION: Lingular atelectatic changes. Electronically Signed   By: Inez Catalina M.D.   On: 10/21/2017 09:02    Assessment and Plan:   Ronnie Reyes is a 67 y.o. y/o male has been referred for iron deficiency anemia with no overt blood loss. Some non specific post prandial pain after meals for 1 year, negative outside Ct abdomen in 2018 .   Plan : 1. Celiac serology  2. Urine analysis 3. EGD+colonoscopy and if negative will need capsule study of the small bowel   I have discussed alternative options, risks & benefits,  which include, but are not limited to, bleeding, infection, perforation,respiratory complication & drug reaction.  The patient agrees with this plan & written consent will be obtained.    Follow up in 8 weeks  Dr Jonathon Bellows MD,MRCP(U.K)

## 2017-11-11 NOTE — Telephone Encounter (Signed)
Patient called and needs to know if they need to pick up Prilosec at the pharmacy. Please call

## 2017-11-11 NOTE — Telephone Encounter (Signed)
Ronnie Reyes called and [facility] has Iron Infusion on Friday and Tuesday next week. Do they need to r/s the infusions?

## 2017-11-12 ENCOUNTER — Ambulatory Visit: Payer: Medicare Other | Admitting: Oncology

## 2017-11-13 ENCOUNTER — Inpatient Hospital Stay: Payer: Medicare Other

## 2017-11-13 ENCOUNTER — Other Ambulatory Visit
Admission: RE | Admit: 2017-11-13 | Discharge: 2017-11-13 | Disposition: A | Discharge status: Home / Self Care | Payer: Medicare Other | Source: Ambulatory Visit | Attending: Gastroenterology | Admitting: Gastroenterology

## 2017-11-13 VITALS — BP 135/78 | HR 90 | Temp 98.2°F | Resp 18

## 2017-11-13 DIAGNOSIS — D509 Iron deficiency anemia, unspecified: Secondary | ICD-10-CM

## 2017-11-13 LAB — CELIAC DISEASE PANEL
ENDOMYSIAL ANTIBODY IGA: NEGATIVE
IgA: 402 mg/dL (ref 61–437)
Tissue Transglutaminase Ab, IgA: <2 U/mL (ref 0–3)

## 2017-11-13 MED ORDER — IRON SUCROSE 20 MG/ML IV SOLN
200.0000 mg | Freq: Once | INTRAVENOUS | Status: AC
  Administered 2017-11-13: 200 mg via INTRAVENOUS
  Filled 2017-11-13: qty 10

## 2017-11-13 MED ORDER — SODIUM CHLORIDE 0.9 % IV SOLN
Freq: Once | INTRAVENOUS | Status: AC
  Administered 2017-11-13: 13:00:00 via INTRAVENOUS
  Filled 2017-11-13: qty 1000

## 2017-11-15 ENCOUNTER — Encounter: Payer: Self-pay | Admitting: Gastroenterology

## 2017-11-17 ENCOUNTER — Inpatient Hospital Stay: Payer: Medicare Other

## 2017-11-17 VITALS — BP 127/78 | HR 86 | Temp 98.8°F | Resp 19

## 2017-11-17 DIAGNOSIS — D509 Iron deficiency anemia, unspecified: Secondary | ICD-10-CM

## 2017-11-17 MED ORDER — IRON SUCROSE 20 MG/ML IV SOLN
200.0000 mg | Freq: Once | INTRAVENOUS | Status: AC
  Administered 2017-11-17: 200 mg via INTRAVENOUS
  Filled 2017-11-17: qty 10

## 2017-11-17 MED ORDER — SODIUM CHLORIDE 0.9 % IV SOLN
Freq: Once | INTRAVENOUS | Status: AC
Start: 2017-11-17 — End: 2017-11-17
  Administered 2017-11-17: 14:00:00 via INTRAVENOUS
  Filled 2017-11-17: qty 1000

## 2017-11-19 ENCOUNTER — Ambulatory Visit: Payer: Medicare Other | Admitting: Certified Registered Nurse Anesthetist

## 2017-11-19 ENCOUNTER — Encounter: Payer: Self-pay | Admitting: *Deleted

## 2017-11-19 ENCOUNTER — Ambulatory Visit
Admission: RE | Admit: 2017-11-19 | Discharge: 2017-11-19 | Disposition: A | Discharge status: Home / Self Care | Payer: Medicare Other | Source: Ambulatory Visit | Attending: Gastroenterology | Admitting: Gastroenterology

## 2017-11-19 ENCOUNTER — Encounter
Admission: RE | Disposition: A | Discharge status: Home / Self Care | Payer: Self-pay | Source: Ambulatory Visit | Attending: Gastroenterology

## 2017-11-19 DIAGNOSIS — I10 Essential (primary) hypertension: Secondary | ICD-10-CM | POA: Insufficient documentation

## 2017-11-19 DIAGNOSIS — Z888 Allergy status to other drugs, medicaments and biological substances status: Secondary | ICD-10-CM | POA: Insufficient documentation

## 2017-11-19 DIAGNOSIS — D122 Benign neoplasm of ascending colon: Secondary | ICD-10-CM | POA: Diagnosis not present

## 2017-11-19 DIAGNOSIS — K3189 Other diseases of stomach and duodenum: Secondary | ICD-10-CM | POA: Insufficient documentation

## 2017-11-19 DIAGNOSIS — Z85828 Personal history of other malignant neoplasm of skin: Secondary | ICD-10-CM | POA: Diagnosis not present

## 2017-11-19 DIAGNOSIS — D3A092 Benign carcinoid tumor of the stomach: Secondary | ICD-10-CM | POA: Diagnosis not present

## 2017-11-19 DIAGNOSIS — K219 Gastro-esophageal reflux disease without esophagitis: Secondary | ICD-10-CM | POA: Insufficient documentation

## 2017-11-19 DIAGNOSIS — D120 Benign neoplasm of cecum: Secondary | ICD-10-CM

## 2017-11-19 DIAGNOSIS — J45909 Unspecified asthma, uncomplicated: Secondary | ICD-10-CM | POA: Insufficient documentation

## 2017-11-19 DIAGNOSIS — Z9889 Other specified postprocedural states: Secondary | ICD-10-CM | POA: Insufficient documentation

## 2017-11-19 DIAGNOSIS — D509 Iron deficiency anemia, unspecified: Secondary | ICD-10-CM | POA: Diagnosis not present

## 2017-11-19 DIAGNOSIS — Z8051 Family history of malignant neoplasm of kidney: Secondary | ICD-10-CM | POA: Diagnosis not present

## 2017-11-19 DIAGNOSIS — Z7952 Long term (current) use of systemic steroids: Secondary | ICD-10-CM | POA: Insufficient documentation

## 2017-11-19 DIAGNOSIS — D123 Benign neoplasm of transverse colon: Secondary | ICD-10-CM | POA: Insufficient documentation

## 2017-11-19 DIAGNOSIS — Z7951 Long term (current) use of inhaled steroids: Secondary | ICD-10-CM | POA: Diagnosis not present

## 2017-11-19 DIAGNOSIS — Z9049 Acquired absence of other specified parts of digestive tract: Secondary | ICD-10-CM | POA: Diagnosis not present

## 2017-11-19 DIAGNOSIS — Z79899 Other long term (current) drug therapy: Secondary | ICD-10-CM | POA: Insufficient documentation

## 2017-11-19 DIAGNOSIS — K5730 Diverticulosis of large intestine without perforation or abscess without bleeding: Secondary | ICD-10-CM | POA: Diagnosis not present

## 2017-11-19 DIAGNOSIS — K2950 Unspecified chronic gastritis without bleeding: Secondary | ICD-10-CM | POA: Diagnosis not present

## 2017-11-19 DIAGNOSIS — K317 Polyp of stomach and duodenum: Secondary | ICD-10-CM

## 2017-11-19 HISTORY — PX: ESOPHAGOGASTRODUODENOSCOPY (EGD) WITH PROPOFOL: SHX5813

## 2017-11-19 HISTORY — PX: COLONOSCOPY WITH PROPOFOL: SHX5780

## 2017-11-19 SURGERY — ESOPHAGOGASTRODUODENOSCOPY (EGD) WITH PROPOFOL
Anesthesia: General

## 2017-11-19 MED ORDER — LIDOCAINE HCL (CARDIAC) 20 MG/ML IV SOLN
INTRAVENOUS | Status: DC | PRN
  Administered 2017-11-19: 100 mg via INTRAVENOUS

## 2017-11-19 MED ORDER — GLYCOPYRROLATE 0.2 MG/ML IJ SOLN
INTRAMUSCULAR | Status: DC | PRN
  Administered 2017-11-19: 0.2 mg via INTRAVENOUS

## 2017-11-19 MED ORDER — PROPOFOL 500 MG/50ML IV EMUL
INTRAVENOUS | Status: DC | PRN
  Administered 2017-11-19: 160 ug/kg/min via INTRAVENOUS

## 2017-11-19 MED ORDER — PROPOFOL 500 MG/50ML IV EMUL
INTRAVENOUS | Status: AC
  Filled 2017-11-19: qty 50

## 2017-11-19 MED ORDER — PROPOFOL 10 MG/ML IV BOLUS
INTRAVENOUS | Status: DC | PRN
  Administered 2017-11-19: 70 mg via INTRAVENOUS

## 2017-11-19 MED ORDER — SODIUM CHLORIDE 0.9 % IV SOLN
INTRAVENOUS | Status: DC
  Administered 2017-11-19: 09:00:00 via INTRAVENOUS

## 2017-11-19 NOTE — Transfer of Care (Signed)
Immediate Anesthesia Transfer of Care Note  Patient: Ronnie Reyes  Procedure(s) Performed: ESOPHAGOGASTRODUODENOSCOPY (EGD) WITH PROPOFOL (N/A ) COLONOSCOPY WITH PROPOFOL (N/A )  Patient Location: PACU  Anesthesia Type:General  Level of Consciousness: sedated  Airway & Oxygen Therapy: Pt spontaneously breathing and connected to nasal cannula oxygen  Post-op Assessment: Report given to RN and Post -op Vital signs reviewed and stable  Post vital signs: Reviewed and stable  Last Vitals:  Vitals:   11/19/17 0814 11/19/17 0947  BP: (!) 143/82   Pulse: 80 (P) 77  Resp: 18 (!) (P) 21  Temp: (!) 35.8 C (!) (P) 36.3 C  SpO2: 97% (P) 95%    Last Pain:  Vitals:   11/19/17 0814  TempSrc: Tympanic         Complications: No apparent anesthesia complications

## 2017-11-19 NOTE — Anesthesia Procedure Notes (Signed)
Performed by: Doninique Lwin, CRNA Pre-anesthesia Checklist: Patient identified, Emergency Drugs available, Suction available, Patient being monitored and Timeout performed Patient Re-evaluated:Patient Re-evaluated prior to induction Oxygen Delivery Method: Nasal cannula Induction Type: IV induction       

## 2017-11-19 NOTE — Anesthesia Preprocedure Evaluation (Signed)
Anesthesia Evaluation  Patient identified by MRN, date of birth, ID band Patient awake  General Assessment Comment:Hard time waking up after appendectomy  Reviewed: Allergy & Precautions, NPO status , Patient's Chart, lab work & pertinent test results  History of Anesthesia Complications (+) history of anesthetic complications  Airway Mallampati: II       Dental  (+) Caps, Chipped   Pulmonary neg shortness of breath, asthma , COPD,  COPD inhaler, neg recent URI,    Pulmonary exam normal breath sounds clear to auscultation       Cardiovascular Exercise Tolerance: Good hypertension, Pt. on medications (-) angina(-) CAD, (-) Past MI, (-) Cardiac Stents and (-) CABG Normal cardiovascular exam(-) dysrhythmias (-) Valvular Problems/Murmurs     Neuro/Psych    GI/Hepatic GERD  Medicated and Controlled,Anal fissure..     Endo/Other  negative endocrine ROS  Renal/GU negative Renal ROS  negative genitourinary   Musculoskeletal negative musculoskeletal ROS (+)   Abdominal Normal abdominal exam  (+)   Peds negative pediatric ROS (+)  Hematology  (+) anemia ,   Anesthesia Other Findings Skin CA  Reproductive/Obstetrics                             Anesthesia Physical  Anesthesia Plan  ASA: III  Anesthesia Plan: General   Post-op Pain Management:    Induction: Intravenous  PONV Risk Score and Plan: 2 and Propofol infusion  Airway Management Planned: Nasal Cannula  Additional Equipment:   Intra-op Plan:   Post-operative Plan:   Informed Consent: I have reviewed the patients History and Physical, chart, labs and discussed the procedure including the risks, benefits and alternatives for the proposed anesthesia with the patient or authorized representative who has indicated his/her understanding and acceptance.   Dental advisory given  Plan Discussed with: CRNA and Surgeon  Anesthesia  Plan Comments: (GOT for anal fissure surgery)        Anesthesia Quick Evaluation

## 2017-11-19 NOTE — Anesthesia Post-op Follow-up Note (Signed)
Anesthesia QCDR form completed.        

## 2017-11-19 NOTE — Progress Notes (Signed)
Monitor placed on patient for Capsul monitoring. Wife is at the chairside and pt is alert, calm and has no s/s of distress. Orders and patient assessed and will continue to monitor and tx pt according to MD order. Pt given instructions on Capsul.

## 2017-11-19 NOTE — Anesthesia Postprocedure Evaluation (Signed)
Anesthesia Post Note  Patient: Ronnie Reyes  Procedure(s) Performed: ESOPHAGOGASTRODUODENOSCOPY (EGD) WITH PROPOFOL (N/A ) COLONOSCOPY WITH PROPOFOL (N/A )  Patient location during evaluation: Endoscopy Anesthesia Type: General Level of consciousness: awake and alert Pain management: pain level controlled Vital Signs Assessment: post-procedure vital signs reviewed and stable Respiratory status: spontaneous breathing, nonlabored ventilation, respiratory function stable and patient connected to nasal cannula oxygen Cardiovascular status: blood pressure returned to baseline and stable Postop Assessment: no apparent nausea or vomiting Anesthetic complications: no     Last Vitals:  Vitals:   11/19/17 1017 11/19/17 1028  BP: 120/82 136/84  Pulse: 77 78  Resp: 15 16  Temp:    SpO2: 98% 98%    Last Pain:  Vitals:   11/19/17 1100  TempSrc:   PainSc: 0-No pain                 Martha Clan

## 2017-11-19 NOTE — Op Note (Addendum)
[facility] Gastroenterology Patient Name: Ronnie Reyes Procedure Date: 11/19/2017 9:18 AM MRN: [id number] Account #: [id number] Date of Birth: July 02, 1951 Admit Type: Outpatient Age: 67 Room: [facility] ENDO ROOM 4 Gender: Male Note Status: Finalized Procedure:            Colonoscopy Indications:          Iron deficiency anemia Providers:            Jonathon Bellows MD, MD Referring MD:         Gayland Curry MD, MD (Referring MD) Medicines:            Monitored Anesthesia Care Complications:        No immediate complications. Procedure:            Pre-Anesthesia Assessment:                       - Prior to the procedure, a History and Physical was                        performed, and patient medications, allergies and                        sensitivities were reviewed. The patient's tolerance of                        previous anesthesia was reviewed.                       - The risks and benefits of the procedure and the                        sedation options and risks were discussed with the                        patient. All questions were answered and informed                        consent was obtained.                       - ASA Grade Assessment: III - A patient with severe                        systemic disease.                       After obtaining informed consent, the colonoscope was                        passed under direct vision. Throughout the procedure,                        the patient's blood pressure, pulse, and oxygen                        saturations were monitored continuously. The                        Colonoscope was introduced through the anus and  advanced to the the cecum, identified by the                        appendiceal orifice, IC valve and transillumination.                        The colonoscopy was performed with ease. The patient                        tolerated the procedure well. The quality of the  bowel                        preparation was good. Findings:      The perianal and digital rectal examinations were normal.      A 3 mm polyp was found in the cecum. The polyp was sessile. The polyp       was removed with a cold biopsy forceps. Resection and retrieval were       complete.      A 7 mm polyp was found in the transverse colon. The polyp was sessile.       The polyp was removed with a cold snare. Resection and retrieval were       complete.      Multiple small-mouthed diverticula were found in the sigmoid colon.      The entire examined colon appeared normal on direct and retroflexion       views. Impression:           - One 3 mm polyp in the ascending colon, removed with a                        cold biopsy forceps. Resected and retrieved.                       - One 7 mm polyp in the transverse colon, removed with                        a cold snare. Resected and retrieved.                       - Diverticulosis in the sigmoid colon.                       - The entire examined colon is normal on direct and                        retroflexion views. Recommendation:       - Discharge patient to home (with escort).                       - Resume previous diet.                       - Continue present medications.                       - Await pathology results.                       - Repeat colonoscopy in 5 years for surveillance based  on pathology results.                       - To visualize the small bowel, perform video capsule                        endoscopy today. Procedure Code(s):    --- Professional ---                       [phone], Colonoscopy, flexible; with removal of tumor(s),                        polyp(s), or other lesion(s) by snare technique                       45380, 38, Colonoscopy, flexible; with biopsy, single                        or multiple Diagnosis Code(s):    --- Professional ---                       D12.2, Benign  neoplasm of ascending colon                       D12.3, Benign neoplasm of transverse colon (hepatic                        flexure or splenic flexure)                       D50.9, Iron deficiency anemia, unspecified                       K57.30, Diverticulosis of large intestine without                        perforation or abscess without bleeding CPT copyright 2016 American Medical Association. All rights reserved. The codes documented in this report are preliminary and upon coder review may  be revised to meet current compliance requirements. Jonathon Bellows, MD Jonathon Bellows MD, MD 11/19/2017 9:51:04 AM This report has been signed electronically. Number of Addenda: 0 Note Initiated On: 11/19/2017 9:18 AM Scope Withdrawal Time: 0 hours 11 minutes 41 seconds  Total Procedure Duration: 0 hours 13 minutes 20 seconds       [facility]

## 2017-11-19 NOTE — Op Note (Signed)
[facility] Gastroenterology Patient Name: Ronnie Reyes Procedure Date: 11/19/2017 9:18 AM MRN: [id number] Account #: [id number] Date of Birth: 1951-03-13 Admit Type: Outpatient Age: 67 Room: [facility] ENDO ROOM 4 Gender: Male Note Status: Finalized Procedure:            Upper GI endoscopy Indications:          Iron deficiency anemia Providers:            Jonathon Bellows MD, MD Referring MD:         Gayland Curry MD, MD (Referring MD) Medicines:            Monitored Anesthesia Care Complications:        No immediate complications. Procedure:            Pre-Anesthesia Assessment:                       - Prior to the procedure, a History and Physical was                        performed, and patient medications, allergies and                        sensitivities were reviewed. The patient's tolerance of                        previous anesthesia was reviewed.                       - The risks and benefits of the procedure and the                        sedation options and risks were discussed with the                        patient. All questions were answered and informed                        consent was obtained.                       - ASA Grade Assessment: III - A patient with severe                        systemic disease.                       After obtaining informed consent, the endoscope was                        passed under direct vision. Throughout the procedure,                        the patient's blood pressure, pulse, and oxygen                        saturations were monitored continuously. The Endoscope                        was introduced through the mouth, and advanced to the  third part of duodenum. The upper GI endoscopy was                        accomplished with ease. The patient tolerated the                        procedure well. Findings:      The examined duodenum was normal.      The esophagus was normal.  A few 6 to 9 mm sessile polyps with no bleeding and no stigmata of       recent bleeding were found in the gastric antrum. Biopsies were taken       with a cold forceps for histology.      A single 8 mm sessile polyp with no bleeding and no stigmata of recent       bleeding was found on the greater curvature of the stomach and in the       stomach. Biopsies were taken with a cold forceps for histology.      The cardia and gastric fundus were normal on retroflexion. Impression:           - Normal examined duodenum.                       - Normal esophagus.                       - A few gastric polyps. Biopsied.                       - A single gastric polyp. Biopsied. Recommendation:       - Await pathology results.                       - Perform a colonoscopy today. Procedure Code(s):    --- Professional ---                       [id number], Esophagogastroduodenoscopy, flexible, transoral;                        with biopsy, single or multiple Diagnosis Code(s):    --- Professional ---                       K31.7, Polyp of stomach and duodenum                       D50.9, Iron deficiency anemia, unspecified CPT copyright 2016 American Medical Association. All rights reserved. The codes documented in this report are preliminary and upon coder review may  be revised to meet current compliance requirements. Jonathon Bellows, MD Jonathon Bellows MD, MD 11/19/2017 9:29:04 AM This report has been signed electronically. Number of Addenda: 0 Note Initiated On: 11/19/2017 9:18 AM      [facility]

## 2017-11-20 ENCOUNTER — Inpatient Hospital Stay: Payer: Medicare Other | Attending: Oncology

## 2017-11-20 VITALS — BP 131/83 | HR 91 | Temp 98.0°F | Resp 18

## 2017-11-20 DIAGNOSIS — D509 Iron deficiency anemia, unspecified: Secondary | ICD-10-CM | POA: Diagnosis not present

## 2017-11-20 DIAGNOSIS — D72829 Elevated white blood cell count, unspecified: Secondary | ICD-10-CM | POA: Diagnosis not present

## 2017-11-20 DIAGNOSIS — D3A8 Other benign neuroendocrine tumors: Secondary | ICD-10-CM | POA: Insufficient documentation

## 2017-11-20 MED ORDER — SODIUM CHLORIDE 0.9 % IV SOLN
Freq: Once | INTRAVENOUS | Status: AC
  Administered 2017-11-20: 14:00:00 via INTRAVENOUS
  Filled 2017-11-20: qty 1000

## 2017-11-20 MED ORDER — IRON SUCROSE 20 MG/ML IV SOLN
200.0000 mg | Freq: Once | INTRAVENOUS | Status: AC
  Administered 2017-11-20: 200 mg via INTRAVENOUS
  Filled 2017-11-20: qty 10

## 2017-11-23 ENCOUNTER — Telehealth: Payer: Self-pay | Admitting: Gastroenterology

## 2017-11-23 NOTE — Telephone Encounter (Signed)
Patient's wife Ronnie Reyes called and was checking on the results from the capsule he swallowed on Thursday 11-19-17. Please call.

## 2017-11-25 ENCOUNTER — Other Ambulatory Visit: Payer: Self-pay | Admitting: Pathology

## 2017-11-25 LAB — SURGICAL PATHOLOGY

## 2017-11-27 ENCOUNTER — Ambulatory Visit (INDEPENDENT_AMBULATORY_CARE_PROVIDER_SITE_OTHER): Payer: Medicare Other | Admitting: Gastroenterology

## 2017-11-27 DIAGNOSIS — K2940 Chronic atrophic gastritis without bleeding: Secondary | ICD-10-CM

## 2017-11-27 DIAGNOSIS — D509 Iron deficiency anemia, unspecified: Secondary | ICD-10-CM | POA: Diagnosis not present

## 2017-11-27 DIAGNOSIS — C7A8 Other malignant neuroendocrine tumors: Secondary | ICD-10-CM | POA: Diagnosis not present

## 2017-11-27 NOTE — Progress Notes (Signed)
Ronnie Bellows MD, MRCP(U.K) [address]  [place]  [place], [place] [address]  Main: [phone]  Fax: [phone]   Primary Care Physician: Gayland Curry, MD  Primary Gastroenterologist:  Dr. Jonathon Bellows   Chief Complaint  Patient presents with  . Results    HPI: Ronnie Reyes is a 67 y.o. male   Summary of history :  Ronnie Reyes is a 67 y.o. y/ o male initially referred and seen on 11/11/17  for iron deficiency anemia .  She follows with hematology oncology for anemia and sees Dr. Tasia Catchings.  In May 2018 the patient had a normal hemoglobin of 16 g, in December 2018 it had dropped to 11.7 g with an MCV of 80.  Iron studies in January 2019 demonstrated an elevated TIBC of 473.  A very low ferritin of 9.  B12 of 330, normal folate levels.  He received IV iron.  He has been referred for further GI evaluation.  A colonoscopy was in March 2017 by Dr. Bary Castilla and showed no polyps.No overt bleeding per history   Interval history   11/11/2016-  11/27/2016   11/11/17- Celiac serology-negative, H pylori stool antigen negative  11/19/17- EGD: A few gastric sessile polyps were seen at the antrum and biopsies suggested intestinal metaplasia and chronic gastritis, atrophic gastritis-negative for H pylori . A 8 mm polyp was also seen close by - I only took sample biopsies from the edge WELL-DIFFERENTIATED NEUROENDOCRINE TUMOR, 2 MM. - LYMPHOVASCULAR INVASION. - TUMOR EXTENDS TO THE DEEP BIOPSY BASE. G1, Well-differentiated neuroendocrine tumor Mitotic Rate: 1 mitoses / 2 mm2 ,Ki-67 Labeling Index: <1 % ,Tumor Extension: At least into the lamina propria ,Margins: Tumor extends to the deep biopsy base   Colonoscopy- 2 tubular adenomas were excised.  Current Outpatient Medications  Medication Sig Dispense Refill  . acetaminophen (TYLENOL) 500 MG tablet Take 1,250 mg by mouth.    Marland Kitchen albuterol (ACCUNEB) 0.63 MG/3ML nebulizer solution Take 1 ampule by nebulization every 6 (six) hours as needed  for wheezing.    Marland Kitchen albuterol (PROVENTIL HFA;VENTOLIN HFA) 108 (90 Base) MCG/ACT inhaler Inhale 2 puffs into the lungs every 6 (six) hours as needed for wheezing or shortness of breath.    Marland Kitchen amLODipine (NORVASC) 10 MG tablet Take 10 mg by mouth every morning.   0  . atorvastatin (LIPITOR) 20 MG tablet Take by mouth.    . budesonide-formoterol (SYMBICORT) 160-4.5 MCG/ACT inhaler Inhale into the lungs.    Marland Kitchen EPINEPHrine 0.3 mg/0.3 mL IJ SOAJ injection Inject into the muscle.    . fexofenadine (ALLEGRA) 180 MG tablet Take 180 mg by mouth daily.    . fluticasone (FLONASE) 50 MCG/ACT nasal spray Place 1 spray into both nostrils daily.   3  . Glucos-Chondroit-Hyaluron-MSM (GLUCOSAMINE CHONDROITIN JOINT) TABS Take by mouth.    Marland Kitchen ipratropium (ATROVENT) 0.02 % nebulizer solution Inhale into the lungs.    . Loratadine 10 MG CAPS Take 10 mg by mouth.    . Multiple Vitamins-Minerals (CENTRUM SILVER PO) Take by mouth once.    . niacin 500 MG tablet Take 500 mg by mouth at bedtime.    . Omega-3 Fatty Acids (FISH OIL) 1000 MG CAPS Take by mouth once.    Marland Kitchen omeprazole (PRILOSEC) 40 MG capsule Take 1 capsule (40 mg total) by mouth daily. 90 capsule 3  . predniSONE (DELTASONE) 10 MG tablet Take 1 tablet by mouth daily.     No current facility-administered medications for this visit.  Allergies as of 11/27/2017 - Review Complete 11/19/2017  Allergen Reaction Noted  . Ace inhibitors Shortness Of Breath 08/28/2015  . Chlorthalidone Anaphylaxis 08/28/2015  . Pravastatin Rash 04/02/2017    ROS:  General: Negative for anorexia, weight loss, fever, chills, fatigue, weakness. ENT: Negative for hoarseness, difficulty swallowing , nasal congestion. CV: Negative for chest pain, angina, palpitations, dyspnea on exertion, peripheral edema.  Respiratory: Negative for dyspnea at rest, dyspnea on exertion, cough, sputum, wheezing.  GI: See history of present illness. GU:  Negative for dysuria, hematuria, urinary  incontinence, urinary frequency, nocturnal urination.  Endo: Negative for unusual weight change.    Physical Examination:   There were no vitals taken for this visit.  General: Well-nourished, well-developed in no acute distress.  Eyes: No icterus. Conjunctivae pink. Mouth: Oropharyngeal mucosa moist and pink , no lesions erythema or exudate. Lungs: Clear to auscultation bilaterally. Non-labored. Heart: Regular rate and rhythm, no murmurs rubs or gallops.  Abdomen: Bowel sounds are normal, nontender, nondistended, no hepatosplenomegaly or masses, no abdominal bruits or hernia , no rebound or guarding.   Extremities: No lower extremity edema. No clubbing or deformities. Neuro: Alert and oriented x 3.  Grossly intact. Skin: Warm and dry, no jaundice.   Psych: Alert and cooperative, normal mood and affect.   Imaging Studies: No results found.  Assessment and Plan:   Ronnie Reyes is a 67 y.o. y/o male here to follow up for iron deficiency anemia with no overt blood loss. Some non specific post prandial pain after meals for 1 year, negative outside Ct abdomen in 2018 . 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 Likely atrophic gastritis cause for impaired iron absorption. 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 Resection is recommended with close EGD follow up every 6-12 months.   Plan : 1. Capsule study of the small bowel apparently done last week - I will read the study on Monday  3. EUS to determine  the gastric polyp invasion depth and resection by EMR/ESD if possible  4. Atrophic gastritis likely cause of iron deficiency anemia- continue iv iron  5. Repeat EGD in 6 months for Gastric mapping  in view of intestinal metaplasia.  6. Colonoscopy in 5 years due to tubular adenomas x 2 7. Serum gastrin levels (he is off PPI) 8. Follow up with Dr Tasia Catchings -likely will need imaging at some point for the NET  Dr Ronnie Bellows  MD,MRCP [facility]) Follow up in 8 weeks

## 2017-11-30 ENCOUNTER — Telehealth: Payer: Self-pay | Admitting: Oncology

## 2017-11-30 ENCOUNTER — Telehealth: Payer: Self-pay | Admitting: *Deleted

## 2017-11-30 NOTE — Telephone Encounter (Signed)
March appts was cancelled as requested, per Lori/schd msg. Also scheduled and confirmed appointment with patient's wife for next week as requested.  Please be advised that patient was not aware as to why he needed to come in next week.  Asked Ronnie Reyes to please give patient a call.

## 2017-11-30 NOTE — Telephone Encounter (Signed)
Called patient to explain the  His appointment was changed due to the results of his EGD.

## 2017-12-01 ENCOUNTER — Encounter: Payer: Self-pay | Admitting: Gastroenterology

## 2017-12-01 LAB — H. PYLORI ANTIGEN, STOOL: H. Pylori Stool Ag, Eia: NEGATIVE

## 2017-12-02 ENCOUNTER — Telehealth: Payer: Self-pay | Admitting: *Deleted

## 2017-12-02 ENCOUNTER — Telehealth: Payer: Self-pay

## 2017-12-02 ENCOUNTER — Telehealth: Payer: Self-pay | Admitting: General Surgery

## 2017-12-02 LAB — GASTRIN: Gastrin: 1364 pg/mL — ABNORMAL HIGH (ref 0–115)

## 2017-12-02 NOTE — Telephone Encounter (Signed)
PLEASE REVIEW PATIENT'S UPPER & LOWER ENDOSCOPY DONE BY DR Vicente Males.CAROLYN HIS WIFE CALLED & WANTED TO KNOW IF YOU REMOVED TUMORS FROM THE STOMACH. I SPOKE WITH MARSHA AND SHE SAID TO RUN IT BY YOU. PLEASE ADVISE.

## 2017-12-02 NOTE — Telephone Encounter (Signed)
Chart review limited by remote phone access   Formal review on return to office next week.

## 2017-12-02 NOTE — Telephone Encounter (Signed)
PATIENT WAS NOTIFIED DR Bary Castilla WOULD REVIEW FURTHER ON HIS RETURN & WE WOULD GET BACK WITH HIM.

## 2017-12-02 NOTE — Telephone Encounter (Signed)
Received referral for EUS to evaluate NET for removal by EMR/ESD. I have spoken with [facility] and this can be evaluated and removed during one procedure EUS at [facility] if it is resectable. I contacted spouse who stated she was under the impression that they would see Dr. Bary Castilla for evaluation of this. I have contacted [facility] for clarification before sending referral to [place]. Oncology Nurse Navigator Documentation  Navigator Location: [facility] (12/02/17 1400)   )Navigator Encounter Type: Telephone (12/02/17 1400) Telephone: [place] Call (12/02/17 1400)                                                  Time Spent with Patient: 15 (12/02/17 1400)

## 2017-12-02 NOTE — Telephone Encounter (Signed)
Patient called checking on referral that Dr.Anna sent him to. Patient doesn't know where or who he was going to refer him to.

## 2017-12-03 ENCOUNTER — Telehealth: Payer: Self-pay

## 2017-12-03 ENCOUNTER — Encounter: Payer: Self-pay | Admitting: Gastroenterology

## 2017-12-03 NOTE — Telephone Encounter (Signed)
Called and spoke with Ronnie Reyes. I am sending request to [facility] for a clinic visit followed by EUS with potential removal via EMR. They will contact her for scheduling/instructions. Oncology Nurse Navigator Documentation  Navigator Location: [facility] (12/03/17 1000)   )Navigator Encounter Type: Telephone (12/03/17 1000) Telephone: [place] Call (12/03/17 1000)                       Barriers/Navigation Needs: Coordination of Care (12/03/17 1000)   Interventions: Coordination of Care (12/03/17 1000)   Coordination of Care: EUS (12/03/17 1000)                  Time Spent with Patient: 15 (12/03/17 1000)

## 2017-12-03 NOTE — Telephone Encounter (Signed)
Ronnie Reyes FROM DR ANNA'S OFFICE CALLED TODAY TO LET us KNOW THE PATIENT WILL BE GOING TO [facility] TO HAVE A SPECIAL PROCEDURE TO REMOVE THE TUMOR FROM HIS STOMACH.NO FUTHER EVALUATION IS REQUIRED FROM [place].

## 2017-12-06 NOTE — Progress Notes (Signed)
Hematology/Oncology Follow up note [facility] Telephone:(336) [phone] [phone]) [phone]   Patient Care Team: Gayland Curry, MD as PCP - General (Family Medicine) Bary Castilla Forest Gleason, MD (General Surgery)  REFERRING PROVIDER: Gayland Curry, MD CHIEF COMPLAINTS/PURPOSE OF CONSULTATION:  Evaluation of anemia.  HISTORY OF PRESENTING ILLNESS:  Ronnie Reyes is a  67 y.o.  male with PMH listed below who was referred to me for evaluation of anemia. Patient follows up with primary care physician and has labs done at her PCPs office. I reviewed patient's lab work from [place] which showed on 10/15/2017, patient had a hemoglobin of 11.7, this was significantly reduced from the lab value of hemoglobin 16 on 03/03/2017.  # Iron deficiency anemia s/p IV iron.  # Referred to GI for work up. 11/19/2017 EGD showed - Normal examined duodenum.- Normal esophagus.- A few gastric polyps. Biopsied.- A single gastric polyp. Biopsied. 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 pT1pNx  Histologic Type and Grade: G1, Well-differentiated neuroendocrine tumor  Mitotic Rate: 1 mitoses / 2 mm2  Ki-67 Labeling Index: <1 %  # Colonoscopy showed colon polyps with were snared and pathology revealed tubular adenoma negative for high grade dysplasia and malignancy.   Patient reports fatigue level has improved a lot. No new complaints.   Review of Systems  Constitutional: Negative for chills, fever and malaise/fatigue.  HENT: Negative for congestion and hearing loss.   Eyes: Negative for double vision.  Cardiovascular: Negative for palpitations.  Gastrointestinal: Negative for abdominal pain, blood in stool, nausea and vomiting.       Epigastric discomfort.  Genitourinary: Negative for dysuria and  hematuria.  Musculoskeletal: Negative for myalgias and neck pain.  Skin: Negative for rash.  Neurological: Negative for dizziness.  Endo/Heme/Allergies: Does not bruise/bleed easily.  Psychiatric/Behavioral: Negative for depression.    MEDICAL HISTORY:  Past Medical History:  Diagnosis Date  . Anemia   . Asthma   . [place]   skin cancer/arm and back  . Complication of anesthesia    HARD TIME WAKING UP AFTER  APPENDECTOMY  . GERD (gastroesophageal reflux disease)   . Hernia   . Hypertension 2005   since 2005    SURGICAL HISTORY: Past Surgical History:  Procedure Laterality Date  . ANAL FISTULOTOMY N/A 01/02/2016   Procedure: ANAL FISTULOTOMY;  Surgeon: JRobert Bellow MD;  Location: [facility] ORS;  Service: General;  Laterality: N/A;  . APPENDECTOMY  2000  . CHOLECYSTECTOMY  2006  . COLONOSCOPY  [id number]  small serrated adenoma removed from the rectum at 15cm. This was his first screening colonoscopy  . COLONOSCOPY WITH PROPOFOL N/A 01/02/2016   Procedure: COLONOSCOPY WITH PROPOFOL;  Surgeon: JRobert Bellow MD;  [facility];  Service: Endoscopy;  Laterality: N/A;  . COLONOSCOPY WITH PROPOFOL N/A 11/19/2017   Procedure: COLONOSCOPY WITH PROPOFOL;  Surgeon: AJonathon Bellows MD;  [facility];  Service: Gastroenterology;  Laterality: N/A;  . ESOPHAGOGASTRODUODENOSCOPY (EGD) WITH PROPOFOL N/A 11/19/2017   Procedure: ESOPHAGOGASTRODUODENOSCOPY (EGD) WITH PROPOFOL;  Surgeon: AJonathon Bellows MD;  [facility];  Service: Gastroenterology;  Laterality: N/A;  . ingrown toenail removal  1964  . NOSE SURGERY  1974    SOCIAL HISTORY: Social History   Socioeconomic History  . Marital status: Married    Spouse name: Not on file  . Number of children: Not  on file  . Years of education: Not on file  . Highest education level: Not on file  Social Needs  . Financial resource strain: Not on file  . Food insecurity - worry: Not on file  . Food  insecurity - inability: Not on file  . Transportation needs - medical: Not on file  . Transportation needs - non-medical: Not on file  Occupational History  . Not on file  Tobacco Use  . Smoking status: Never Smoker  . Smokeless tobacco: Never Used  Substance and Sexual Activity  . Alcohol use: No    Alcohol/week: 0.0 oz  . Drug use: No  . Sexual activity: Yes  Other Topics Concern  . Not on file  Social History Narrative  . Not on file    FAMILY HISTORY: Family History  Problem Relation Age of Onset  . Cancer Father        kidney    ALLERGIES:  is allergic to ace inhibitors; chlorthalidone; and pravastatin.  MEDICATIONS:  Current Outpatient Medications  Medication Sig Dispense Refill  . acetaminophen (TYLENOL) 500 MG tablet Take 1,250 mg by mouth.    Marland Kitchen albuterol (ACCUNEB) 0.63 MG/3ML nebulizer solution Take 1 ampule by nebulization every 6 (six) hours as needed for wheezing.    Marland Kitchen albuterol (PROVENTIL HFA;VENTOLIN HFA) 108 (90 Base) MCG/ACT inhaler Inhale 2 puffs into the lungs every 6 (six) hours as needed for wheezing or shortness of breath.    Marland Kitchen amLODipine (NORVASC) 10 MG tablet Take 10 mg by mouth every morning.   0  . atorvastatin (LIPITOR) 20 MG tablet Take by mouth.    . budesonide-formoterol (SYMBICORT) 160-4.5 MCG/ACT inhaler Inhale into the lungs.    Marland Kitchen EPINEPHrine 0.3 mg/0.3 mL IJ SOAJ injection Inject into the muscle.    . fluticasone (FLONASE) 50 MCG/ACT nasal spray Place 1 spray into both nostrils daily.   3  . Glucos-Chondroit-Hyaluron-MSM (GLUCOSAMINE CHONDROITIN JOINT) TABS Take by mouth.    Marland Kitchen ipratropium (ATROVENT) 0.02 % nebulizer solution Inhale into the lungs.    . Multiple Vitamins-Minerals (CENTRUM SILVER PO) Take by mouth once.    . niacin 500 MG tablet Take 500 mg by mouth at bedtime.    . Omega-3 Fatty Acids (FISH OIL) 1000 MG CAPS Take by mouth once.    Marland Kitchen omeprazole (PRILOSEC) 40 MG capsule Take 1 capsule (40 mg total) by mouth daily. 90 capsule 3   . predniSONE (DELTASONE) 5 MG tablet Take 5 mg by mouth daily with breakfast.    . fexofenadine (ALLEGRA) 180 MG tablet Take 180 mg by mouth daily.    . Loratadine 10 MG CAPS Take 10 mg by mouth.     No current facility-administered medications for this visit.      PHYSICAL EXAMINATION: ECOG PERFORMANCE STATUS: 1 - Symptomatic but completely ambulatory Vitals:   12/07/17 0848  BP: (!) 159/89  Pulse: 93  Resp: 18  Temp: 99.4 F (37.4 C)   Filed Weights   12/07/17 0848  Weight: 202 lb 9.6 oz (91.9 kg)    Physical Exam  Constitutional: He is oriented to person, place, and time and well-developed, well-nourished, and in no distress. No distress.  HENT:  Head: Normocephalic and atraumatic.  Eyes: Conjunctivae are normal. Pupils are equal, round, and reactive to light. Left eye exhibits no discharge.  Neck: Normal range of motion. Neck supple. No JVD present.  Cardiovascular: Normal rate, regular rhythm and normal heart sounds. Exam reveals no friction rub.  No murmur  heard. Pulmonary/Chest: Effort normal. No respiratory distress. He exhibits no tenderness.  Crackles on bilateral bases  Abdominal: Soft. Bowel sounds are normal. He exhibits no distension. There is no rebound and no guarding.  Epigastric tenderness.  Musculoskeletal: Normal range of motion. He exhibits no edema or tenderness.  Neurological: He is alert and oriented to person, place, and time. No cranial nerve deficit.  Skin: Skin is warm and dry. No rash noted. No erythema.  Psychiatric: Affect normal.     LABORATORY DATA:  I have reviewed the data as listed CBC Latest Ref Rng & Units 11/02/2017 12/26/2015  WBC 3.8 - 10.6 K/uL 12.9(H) -  Hemoglobin 13.0 - 18.0 g/dL 12.1(L) 16.9  Hematocrit 40.0 - 52.0 % 38.1(L) -  Platelets 150 - 440 K/uL 346 -   Reviewed patient's chemistry lab work that was done on 10/15/2017 at [facility] system Normal kidney and liver function.    ASSESSMENT & PLAN:  1.  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 Iron deficiency anemia, unspecified iron deficiency anemia type   3. Leukocytosis, unspecified type   4. Occult blood positive stool   # Iron deficiency anemia:s/p Venofer 24m twice a week for 5 doses.  Repeat iron panel revealed improved iron store and normal hemoglobin.    # Pathology results were discussed with patient and his wife. Well differentiated Neuroendocrine tumor is indolent and I agree with EUS/resection which will take place this Friday.  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   All questions were answered. The patient knows to call the clinic with any problems questions or concerns.  Return of visit: MD in 2 weeks. Thank you for this kind referral and the opportunity to participate in the care of this patient. A copy of today's note is routed to referring provider    ZEarlie Server MD, PhD Hematology Oncology [facility] CenterPager- [phone]/18/2019

## 2017-12-07 ENCOUNTER — Inpatient Hospital Stay: Payer: Medicare Other

## 2017-12-07 ENCOUNTER — Inpatient Hospital Stay (HOSPITAL_BASED_OUTPATIENT_CLINIC_OR_DEPARTMENT_OTHER): Payer: Medicare Other | Admitting: Oncology

## 2017-12-07 ENCOUNTER — Encounter: Payer: Self-pay | Admitting: Oncology

## 2017-12-07 VITALS — BP 159/89 | HR 93 | Temp 99.4°F | Resp 18 | Ht 66.0 in | Wt 202.6 lb

## 2017-12-07 DIAGNOSIS — D3A8 Other benign neuroendocrine tumors: Secondary | ICD-10-CM

## 2017-12-07 DIAGNOSIS — D509 Iron deficiency anemia, unspecified: Secondary | ICD-10-CM

## 2017-12-07 DIAGNOSIS — R195 Other fecal abnormalities: Secondary | ICD-10-CM

## 2017-12-07 DIAGNOSIS — D72829 Elevated white blood cell count, unspecified: Secondary | ICD-10-CM

## 2017-12-07 DIAGNOSIS — C7A8 Other malignant neuroendocrine tumors: Secondary | ICD-10-CM

## 2017-12-07 LAB — IRON AND TIBC
IRON: 44 ug/dL — AB (ref 45–182)
SATURATION RATIOS: 11 % — AB (ref 17.9–39.5)
TIBC: 389 ug/dL (ref 250–450)
UIBC: 345 ug/dL

## 2017-12-07 LAB — CBC WITH DIFFERENTIAL/PLATELET
BASOS PCT: 1 %
Basophils Absolute: 0.1 10*3/uL (ref 0–0.1)
EOS ABS: 0.1 10*3/uL (ref 0–0.7)
EOS PCT: 1 %
HCT: 42.9 % (ref 40.0–52.0)
HEMOGLOBIN: 14.3 g/dL (ref 13.0–18.0)
LYMPHS ABS: 1.8 10*3/uL (ref 1.0–3.6)
Lymphocytes Relative: 16 %
MCH: 27.4 pg (ref 26.0–34.0)
MCHC: 33.3 g/dL (ref 32.0–36.0)
MCV: 82.2 fL (ref 80.0–100.0)
MONO ABS: 0.7 10*3/uL (ref 0.2–1.0)
MONOS PCT: 6 %
Neutro Abs: 8.6 10*3/uL — ABNORMAL HIGH (ref 1.4–6.5)
Neutrophils Relative %: 76 %
PLATELETS: 276 10*3/uL (ref 150–440)
RBC: 5.22 MIL/uL (ref 4.40–5.90)
RDW: 21.1 % — AB (ref 11.5–14.5)
WBC: 11.2 10*3/uL — ABNORMAL HIGH (ref 3.8–10.6)

## 2017-12-07 LAB — FERRITIN: FERRITIN: 42 ng/mL (ref 24–336)

## 2017-12-07 NOTE — Telephone Encounter (Signed)
LVM for patient callback for results and information per Dr. Vicente Males.    - Inform that the gastrin which is a hormone of the stomach is elevated and is associated with the type of lesion he had in his stomach and this is the one which has the good prognosis.   The test helps to conform what we already knew about the polyp.   Has he been referred for EUS for the lesion? That would be the next step.   He does "NOT" need surgery

## 2017-12-07 NOTE — Progress Notes (Signed)
No changes noted today 

## 2017-12-11 DIAGNOSIS — Z859 Personal history of malignant neoplasm, unspecified: Secondary | ICD-10-CM

## 2017-12-11 DIAGNOSIS — D3A8 Other benign neuroendocrine tumors: Secondary | ICD-10-CM | POA: Insufficient documentation

## 2017-12-11 HISTORY — DX: Personal history of malignant neoplasm, unspecified: Z85.9

## 2017-12-17 ENCOUNTER — Other Ambulatory Visit: Payer: Medicare Other

## 2017-12-18 ENCOUNTER — Ambulatory Visit: Payer: Medicare Other

## 2017-12-18 ENCOUNTER — Ambulatory Visit: Payer: Medicare Other | Admitting: Oncology

## 2017-12-21 ENCOUNTER — Inpatient Hospital Stay: Payer: Medicare Other | Attending: Oncology | Admitting: Oncology

## 2017-12-21 ENCOUNTER — Other Ambulatory Visit: Payer: Self-pay

## 2017-12-21 ENCOUNTER — Inpatient Hospital Stay: Payer: Medicare Other

## 2017-12-21 VITALS — BP 132/86 | HR 87 | Temp 99.1°F | Wt 204.2 lb

## 2017-12-21 DIAGNOSIS — C7A8 Other malignant neuroendocrine tumors: Secondary | ICD-10-CM | POA: Diagnosis not present

## 2017-12-21 DIAGNOSIS — D509 Iron deficiency anemia, unspecified: Secondary | ICD-10-CM | POA: Diagnosis not present

## 2017-12-21 DIAGNOSIS — K2940 Chronic atrophic gastritis without bleeding: Secondary | ICD-10-CM | POA: Diagnosis not present

## 2017-12-21 DIAGNOSIS — K317 Polyp of stomach and duodenum: Secondary | ICD-10-CM

## 2017-12-21 NOTE — Progress Notes (Signed)
Hematology/Oncology Follow up note [facility] Telephone:(336) [phone] Fax:(336) [phone]   Patient Care Team: Gayland Curry, MD as PCP - General (Family Medicine) Bary Castilla Forest Gleason, MD (General Surgery)  REFERRING PROVIDER: Gayland Curry, MD CHIEF COMPLAINTS/PURPOSE OF CONSULTATION:  Evaluation of anemia.  HISTORY OF PRESENTING ILLNESS:  Ronnie Reyes is a  67 y.o.  male with PMH listed below who was referred to me for evaluation of anemia. Patient follows up with primary care physician and has labs done at her PCPs office. I reviewed patient's lab work from [place] which showed on 10/15/2017, patient had a hemoglobin of 11.7, this was significantly reduced from the lab value of hemoglobin 16 on 03/03/2017.  # Iron deficiency anemia s/p IV iron.  # Referred to GI for work up. 11/19/2017 EGD showed - Normal examined duodenum.- Normal esophagus.- A few gastric polyps. Biopsied.- A single gastric polyp. Biopsied. 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 pT1pNx  Histologic Type and Grade: G1, Well-differentiated neuroendocrine tumor  Mitotic Rate: 1 mitoses / 2 mm2  Ki-67 Labeling Index: <1 %  # Colonoscopy showed colon polyps with were snared and pathology revealed tubular adenoma negative for high grade dysplasia and malignancy.   INTERVAL HISTORY Ronnie HEFFNERis a 67y.o. male who has above history reviewed by me today presents for follow up visit for management of neuroendocrine carcinoma. 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  Patient presents to discuss about pathology result and further treatment plan.   Problems and complaints are listed  below:Patient reports fatigue level has improved a lot. No new complaints.   Review of Systems  Constitutional: Negative for chills, fever, malaise/fatigue and weight loss.  HENT: Negative for congestion, hearing loss and nosebleeds.   Eyes: Negative for double vision and pain.  Respiratory: Negative for cough.   Cardiovascular: Negative for palpitations.  Gastrointestinal: Negative for abdominal pain, blood in stool, nausea and vomiting.  Genitourinary: Negative for dysuria, hematuria and urgency.  Musculoskeletal: Negative for back pain, myalgias and neck pain.  Skin: Negative for rash.  Neurological: Negative for dizziness, tingling and tremors.  Endo/Heme/Allergies: Does not bruise/bleed easily.  Psychiatric/Behavioral: Negative for depression and substance abuse. The patient is not nervous/anxious.     MEDICAL HISTORY:  Past Medical History:  Diagnosis Date  . Anemia   . Asthma   . [place]   skin cancer/arm and back  . Complication of anesthesia    HARD TIME WAKING UP AFTER  APPENDECTOMY  . GERD (gastroesophageal reflux disease)   . Hernia   . Hypertension 2005   since 2005    SURGICAL HISTORY: Past Surgical History:  Procedure Laterality Date  . ANAL FISTULOTOMY N/A 01/02/2016   Procedure: ANAL FISTULOTOMY;  Surgeon: JRobert Bellow MD;  Location: [facility] ORS;  Service: General;  Laterality: N/A;  . APPENDECTOMY  2000  . CHOLECYSTECTOMY  2006  . COLONOSCOPY  [id number]  small serrated adenoma removed from the rectum at 15cm. This was his first screening colonoscopy  . COLONOSCOPY WITH PROPOFOL N/A 01/02/2016   Procedure: COLONOSCOPY WITH PROPOFOL;  Surgeon: JRobert Bellow MD;  [facility];  Service: Endoscopy;  Laterality: N/A;  . COLONOSCOPY WITH PROPOFOL N/A 11/19/2017   Procedure: COLONOSCOPY WITH PROPOFOL;  Surgeon: Jonathon Bellows, MD;  [facility];  Service: Gastroenterology;  Laterality: N/A;  . ESOPHAGOGASTRODUODENOSCOPY  (EGD) WITH PROPOFOL N/A 11/19/2017   Procedure: ESOPHAGOGASTRODUODENOSCOPY (EGD) WITH PROPOFOL;  Surgeon: Jonathon Bellows, MD;  [facility];  Service: Gastroenterology;  Laterality: N/A;  . ingrown toenail removal  1964  . NOSE SURGERY  1974    SOCIAL HISTORY: Social History   Socioeconomic History  . Marital status: Married    Spouse name: Not on file  . Number of children: Not on file  . Years of education: Not on file  . Highest education level: Not on file  Social Needs  . Financial resource strain: Not on file  . Food insecurity - worry: Not on file  . Food insecurity - inability: Not on file  . Transportation needs - medical: Not on file  . Transportation needs - non-medical: Not on file  Occupational History  . Not on file  Tobacco Use  . Smoking status: Never Smoker  . Smokeless tobacco: Never Used  Substance and Sexual Activity  . Alcohol use: No    Alcohol/week: 0.0 oz  . Drug use: No  . Sexual activity: Yes  Other Topics Concern  . Not on file  Social History Narrative  . Not on file    FAMILY HISTORY: Family History  Problem Relation Age of Onset  . Cancer Father        kidney    ALLERGIES:  is allergic to ace inhibitors; chlorthalidone; and pravastatin.  MEDICATIONS:  Current Outpatient Medications  Medication Sig Dispense Refill  . acetaminophen (TYLENOL) 500 MG tablet Take 1,250 mg by mouth.    Marland Kitchen albuterol (ACCUNEB) 0.63 MG/3ML nebulizer solution Take 1 ampule by nebulization every 6 (six) hours as needed for wheezing.    Marland Kitchen albuterol (PROVENTIL HFA;VENTOLIN HFA) 108 (90 Base) MCG/ACT inhaler Inhale 2 puffs into the lungs every 6 (six) hours as needed for wheezing or shortness of breath.    Marland Kitchen amLODipine (NORVASC) 10 MG tablet Take 10 mg by mouth every morning.   0  . atorvastatin (LIPITOR) 20 MG tablet Take by mouth.    . budesonide-formoterol (SYMBICORT) 160-4.5 MCG/ACT inhaler Inhale into the lungs.    Marland Kitchen EPINEPHrine 0.3 mg/0.3 mL IJ SOAJ  injection Inject into the muscle.    . fexofenadine (ALLEGRA) 180 MG tablet Take 180 mg by mouth daily.    . fluticasone (FLONASE) 50 MCG/ACT nasal spray Place 1 spray into both nostrils daily.   3  . Glucos-Chondroit-Hyaluron-MSM (GLUCOSAMINE CHONDROITIN JOINT) TABS Take by mouth.    Marland Kitchen ipratropium (ATROVENT) 0.02 % nebulizer solution Inhale into the lungs.    . Loratadine 10 MG CAPS Take 10 mg by mouth.    . Multiple Vitamins-Minerals (CENTRUM SILVER PO) Take by mouth once.    . niacin 500 MG tablet Take 500 mg by mouth at bedtime.    . Omega-3 Fatty Acids (FISH OIL) 1000 MG CAPS Take by mouth once.    Marland Kitchen omeprazole (PRILOSEC) 40 MG capsule Take 1 capsule (40 mg total) by mouth daily. 90 capsule 3  . predniSONE (DELTASONE) 5 MG tablet Take 5 mg by mouth daily with breakfast.     No current facility-administered medications for this visit.      PHYSICAL EXAMINATION: ECOG PERFORMANCE STATUS: 1 - Symptomatic but completely ambulatory Vitals:   12/21/17 1459  BP: 132/86  Pulse: 87  Temp: 99.1 F (37.3 C)   Filed Weights   12/21/17 1459  Weight: 204  lb 3 oz (92.6 kg)    Physical Exam  Constitutional: He is oriented to person, place, and time and well-developed, well-nourished, and in no distress. No distress.  HENT:  Head: Normocephalic and atraumatic.  Eyes: Conjunctivae are normal. Pupils are equal, round, and reactive to light. Left eye exhibits no discharge.  Neck: Normal range of motion. Neck supple. No JVD present.  Cardiovascular: Normal rate, regular rhythm and normal heart sounds. Exam reveals no friction rub.  No murmur heard. Pulmonary/Chest: Effort normal. No respiratory distress. He exhibits no tenderness.  Abdominal: Soft. Bowel sounds are normal. He exhibits no distension. There is no rebound and no guarding.  Musculoskeletal: Normal range of motion. He exhibits no edema or tenderness.  Neurological: He is alert and oriented to person, place, and time. No cranial  nerve deficit.  Skin: Skin is warm and dry. No rash noted. No erythema.  Psychiatric: Affect normal.     LABORATORY DATA:  I have reviewed the data as listed CBC Latest Ref Rng & Units 12/07/2017 11/02/2017 12/26/2015  WBC 3.8 - 10.6 K/uL 11.2(H) 12.9(H) -  Hemoglobin 13.0 - 18.0 g/dL 14.3 12.1(L) 16.9  Hematocrit 40.0 - 52.0 % 42.9 38.1(L) -  Platelets 150 - 440 K/uL 276 346 -   Reviewed patient's chemistry lab work that was done on 10/15/2017 at [place] system Normal kidney and liver function.    ASSESSMENT & PLAN:  1. Iron deficiency anemia, unspecified iron deficiency anemia type   2. Neuroendocrine carcinoma [facility])    # Pathology results were discussed with patient. He has indolent, grade 1 gastric neuroendocrine tumor. Possible Type 1 given the elevated gastrin. However, he was taking PPI while gastrin was tested.  For now I will hold testing another gastrin level as he is on PPI and just had tumor resection. Will retest in the future.  Will obtain DOTADATE PET scan to see if any distant metastasis. Discussed with patient that if no other foci is detected, he just need to be on surveillance. Patient agrees with plan. I will call him about the scan result.   All questions were answered. The patient knows to call the clinic with any problems questions or concerns.  Return of visit: 3 months.  Thank you for this kind referral and the opportunity to participate in the care of this patient. A copy of today's note is routed to referring provider    Earlie Server, MD, PhD Hematology Oncology [facility] at [facility] Pager- [phone] 12/21/2017

## 2017-12-21 NOTE — Progress Notes (Signed)
Patient here today for follow up.   

## 2017-12-22 ENCOUNTER — Encounter: Payer: Self-pay | Admitting: Oncology

## 2017-12-22 DIAGNOSIS — D509 Iron deficiency anemia, unspecified: Secondary | ICD-10-CM | POA: Diagnosis not present

## 2017-12-23 ENCOUNTER — Other Ambulatory Visit: Payer: Self-pay | Admitting: *Deleted

## 2017-12-23 DIAGNOSIS — C7A8 Other malignant neuroendocrine tumors: Secondary | ICD-10-CM

## 2017-12-23 LAB — CHROMOGRANIN A: CHROMOGRANIN A: 3 nmol/L (ref 0–5)

## 2017-12-28 ENCOUNTER — Encounter: Payer: Self-pay | Admitting: *Deleted

## 2017-12-28 ENCOUNTER — Telehealth: Payer: Self-pay | Admitting: *Deleted

## 2017-12-28 NOTE — Telephone Encounter (Signed)
Sent vm notifying them that the lab results from last week have not resulted yet.

## 2017-12-28 NOTE — Telephone Encounter (Signed)
Wife called asking for results from last Monday. Please return call [phone]

## 2017-12-29 LAB — 5 HIAA, QUANTITATIVE, URINE, 24 HOUR
5 HIAA UR: 3 mg/L
5-HIAA,QUANT.,24 HR URINE: 6.2 mg/(24.h) (ref 0.0–14.9)
TOTAL VOLUME: 2050

## 2018-01-04 ENCOUNTER — Ambulatory Visit
Admission: RE | Admit: 2018-01-04 | Discharge: 2018-01-04 | Disposition: A | Discharge status: Home / Self Care | Payer: Medicare Other | Source: Ambulatory Visit | Attending: Oncology | Admitting: Oncology

## 2018-01-04 DIAGNOSIS — C7A8 Other malignant neuroendocrine tumors: Secondary | ICD-10-CM | POA: Insufficient documentation

## 2018-01-04 DIAGNOSIS — R911 Solitary pulmonary nodule: Secondary | ICD-10-CM | POA: Diagnosis not present

## 2018-01-04 MED ORDER — GALLIUM GA 68 DOTATATE IV KIT
5.1000 | PACK | Freq: Once | INTRAVENOUS | Status: AC
  Administered 2018-01-04: 5.1 via INTRAVENOUS

## 2018-02-17 ENCOUNTER — Inpatient Hospital Stay: Payer: Medicare Other

## 2018-02-19 ENCOUNTER — Inpatient Hospital Stay: Payer: Medicare Other | Admitting: Oncology

## 2018-03-24 ENCOUNTER — Other Ambulatory Visit: Payer: Self-pay | Admitting: Oncology

## 2018-03-24 ENCOUNTER — Inpatient Hospital Stay: Payer: Medicare Other | Attending: Oncology

## 2018-03-24 DIAGNOSIS — C7A8 Other malignant neuroendocrine tumors: Secondary | ICD-10-CM | POA: Diagnosis not present

## 2018-03-24 DIAGNOSIS — D509 Iron deficiency anemia, unspecified: Secondary | ICD-10-CM | POA: Diagnosis present

## 2018-03-24 LAB — COMPREHENSIVE METABOLIC PANEL
ALK PHOS: 54 U/L (ref 38–126)
ALT: 19 U/L (ref 17–63)
AST: 25 U/L (ref 15–41)
Albumin: 4.3 g/dL (ref 3.5–5.0)
Anion gap: 10 (ref 5–15)
BUN: 13 mg/dL (ref 6–20)
CALCIUM: 9.2 mg/dL (ref 8.9–10.3)
CO2: 27 mmol/L (ref 22–32)
CREATININE: 0.82 mg/dL (ref 0.61–1.24)
Chloride: 105 mmol/L (ref 101–111)
Glucose, Bld: 98 mg/dL (ref 65–99)
Potassium: 3.9 mmol/L (ref 3.5–5.1)
Sodium: 142 mmol/L (ref 135–145)
TOTAL PROTEIN: 7.6 g/dL (ref 6.5–8.1)
Total Bilirubin: 0.6 mg/dL (ref 0.3–1.2)

## 2018-03-24 LAB — IRON AND TIBC
IRON: 24 ug/dL — AB (ref 45–182)
Saturation Ratios: 5 % — ABNORMAL LOW (ref 17.9–39.5)
TIBC: 462 ug/dL — ABNORMAL HIGH (ref 250–450)
UIBC: 438 ug/dL

## 2018-03-24 LAB — CBC WITH DIFFERENTIAL/PLATELET
Basophils Absolute: 0.1 10*3/uL (ref 0–0.1)
Basophils Relative: 1 %
EOS PCT: 5 %
Eosinophils Absolute: 0.3 10*3/uL (ref 0–0.7)
HCT: 39.5 % — ABNORMAL LOW (ref 40.0–52.0)
Hemoglobin: 13 g/dL (ref 13.0–18.0)
LYMPHS ABS: 2.1 10*3/uL (ref 1.0–3.6)
LYMPHS PCT: 31 %
MCH: 26.5 pg (ref 26.0–34.0)
MCHC: 32.9 g/dL (ref 32.0–36.0)
MCV: 80.6 fL (ref 80.0–100.0)
MONO ABS: 0.5 10*3/uL (ref 0.2–1.0)
MONOS PCT: 7 %
Neutro Abs: 3.7 10*3/uL (ref 1.4–6.5)
Neutrophils Relative %: 56 %
PLATELETS: 257 10*3/uL (ref 150–440)
RBC: 4.9 MIL/uL (ref 4.40–5.90)
RDW: 15.1 % — AB (ref 11.5–14.5)
WBC: 6.6 10*3/uL (ref 3.8–10.6)

## 2018-03-24 LAB — FERRITIN: FERRITIN: 5 ng/mL — AB (ref 24–336)

## 2018-03-26 ENCOUNTER — Other Ambulatory Visit: Payer: Self-pay

## 2018-03-26 ENCOUNTER — Encounter: Payer: Self-pay | Admitting: Oncology

## 2018-03-26 ENCOUNTER — Inpatient Hospital Stay: Payer: Medicare Other

## 2018-03-26 ENCOUNTER — Inpatient Hospital Stay: Payer: Medicare Other | Admitting: Oncology

## 2018-03-26 VITALS — BP 145/75 | HR 81 | Temp 98.5°F | Wt 203.1 lb

## 2018-03-26 VITALS — BP 131/82 | HR 73 | Temp 96.3°F | Resp 18

## 2018-03-26 DIAGNOSIS — D509 Iron deficiency anemia, unspecified: Secondary | ICD-10-CM | POA: Diagnosis not present

## 2018-03-26 DIAGNOSIS — C7A8 Other malignant neuroendocrine tumors: Secondary | ICD-10-CM | POA: Diagnosis not present

## 2018-03-26 MED ORDER — IRON SUCROSE 20 MG/ML IV SOLN
200.0000 mg | INTRAVENOUS | Status: DC
  Administered 2018-03-26: 200 mg via INTRAVENOUS
  Filled 2018-03-26: qty 10

## 2018-03-26 MED ORDER — SODIUM CHLORIDE 0.9 % IV SOLN
INTRAVENOUS | Status: DC
Start: 2018-03-26 — End: 2018-03-26
  Administered 2018-03-26: 14:00:00 via INTRAVENOUS
  Filled 2018-03-26: qty 1000

## 2018-03-26 NOTE — Progress Notes (Signed)
Hematology/Oncology Follow up note [facility] Telephone:([phone] Fax:([phone]) [phone]   Patient Care Team: Gayland Curry, MD as PCP - General (Family Medicine) Bary Castilla Forest Gleason, MD (General Surgery)  REFERRING PROVIDER: Gayland Curry, MD CHIEF COMPLAINTS/PURPOSE OF CONSULTATION:  Evaluation of anemia.  HISTORY OF PRESENTING ILLNESS:  Ronnie Reyes is a  67 y.o.  male with PMH listed below who was referred to me for evaluation of anemia. Patient follows up with primary care physician and has labs done at her PCPs office. I reviewed patient's lab work from [place] which showed on 10/15/2017, patient had a hemoglobin of 11.7, this was significantly reduced from the lab value of hemoglobin 16 on 03/03/2017.  # Iron deficiency anemia s/p IV iron.  # Referred to GI for work up. 11/19/2017 EGD showed - Normal examined duodenum.- Normal esophagus.- A few gastric polyps. Biopsied.- A single gastric polyp. Biopsied. 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 pT1pNx  Histologic Type and Grade: G1, Well-differentiated neuroendocrine tumor  Mitotic Rate: 1 mitoses / 2 mm2  Ki-67 Labeling Index: <1 %  # Colonoscopy showed colon polyps with were snared and pathology revealed tubular adenoma negative for high grade dysplasia and malignancy.   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   INTERVAL HISTORY Ronnie SOEDERis a 67y.o. male who has above history reviewed by me today presents for follow up visit for management of iron deficiency anemia and low grade neuroendocrine tumor.  Problems and complaints are listed below: Fatigue: Better. No other complaints.    Neuroendocrine tumor: stable. Denies any skin flush, diarrhea, abdominal pain.   Review of Systems  Constitutional: Negative for chills, fever, malaise/fatigue and weight loss.  HENT: Negative for congestion, ear discharge, ear pain, hearing loss, nosebleeds, sinus pain and sore throat.   Eyes: Negative for double vision, photophobia, pain, discharge and redness.  Respiratory: Negative for cough, hemoptysis, sputum production, shortness of breath and wheezing.   Cardiovascular: Negative for chest pain, palpitations, orthopnea, claudication and leg swelling.  Gastrointestinal: Negative for abdominal pain, blood in stool, constipation, diarrhea, heartburn, melena, nausea and vomiting.  Genitourinary: Negative for dysuria, flank pain, frequency, hematuria and urgency.  Musculoskeletal: Negative for back pain, myalgias and neck pain.  Skin: Negative for itching and rash.  Neurological: Negative for dizziness, tingling, tremors, focal weakness, weakness and headaches.  Endo/Heme/Allergies: Negative for environmental allergies. Does not bruise/bleed easily.  Psychiatric/Behavioral: Negative for depression, hallucinations and substance abuse. The patient is not nervous/anxious.     MEDICAL HISTORY:  Past Medical History:  Diagnosis Date  . Anemia   . Asthma   . Cancer ([place] 2003   skin cancer/arm and back  . Complication of anesthesia    HARD TIME WAKING UP AFTER  APPENDECTOMY  . GERD (gastroesophageal reflux disease)   . Hernia   . Hypertension 2005   since 2005    SURGICAL HISTORY: Past Surgical History:  Procedure Laterality Date  . ANAL FISTULOTOMY N/A 01/02/2016   Procedure: ANAL FISTULOTOMY;  Surgeon: JRobert Bellow MD;  Location: [facility] ORS;  Service: General;  Laterality: N/A;  . APPENDECTOMY  2000  . CHOLECYSTECTOMY  2006  . COLONOSCOPY  [id number]  small serrated adenoma removed from the rectum at 15cm. This was his  first screening colonoscopy  . COLONOSCOPY WITH  PROPOFOL N/A 01/02/2016   Procedure: COLONOSCOPY WITH PROPOFOL;  Surgeon: Robert Bellow, MD;  Location[facility] ENDOSCOPY;  Service: Endoscopy;  Laterality: N/A;  . COLONOSCOPY WITH PROPOFOL N/A 11/19/2017   Procedure: COLONOSCOPY WITH PROPOFOL;  Surgeon: Jonathon Bellows, MD;  Location: [facility] ENDOSCOPY;  Service: Gastroenterology;  Laterality: N/A;  . ESOPHAGOGASTRODUODENOSCOPY (EGD) WITH PROPOFOL N/A 11/19/2017   Procedure: ESOPHAGOGASTRODUODENOSCOPY (EGD) WITH PROPOFOL;  Surgeon: Jonathon Bellows, MD;  Location: [facility] ENDOSCOPY;  Service: Gastroenterology;  Laterality: N/A;  . ingrown toenail removal  1964  . NOSE SURGERY  1974    SOCIAL HISTORY: Social History   Socioeconomic History  . Marital status: Married    Spouse name: Not on file  . Number of children: Not on file  . Years of education: Not on file  . Highest education level: Not on file  Occupational History  . Not on file  Social Needs  . Financial resource strain: Not on file  . Food insecurity:    Worry: Not on file    Inability: Not on file  . Transportation needs:    Medical: Not on file    Non-medical: Not on file  Tobacco Use  . Smoking status: Never Smoker  . Smokeless tobacco: Never Used  Substance and Sexual Activity  . Alcohol use: No    Alcohol/week: 0.0 oz  . Drug use: No  . Sexual activity: Yes  Lifestyle  . Physical activity:    Days per week: Not on file    Minutes per session: Not on file  . Stress: Not on file  Relationships  . Social connections:    Talks on phone: Not on file    Gets together: Not on file    Attends religious service: Not on file    Active member of club or organization: Not on file    Attends meetings of clubs or organizations: Not on file    Relationship status: Not on file  . Intimate partner violence:    Fear of current or ex partner: Not on file    Emotionally abused: Not on file    Physically abused: Not on file    Forced sexual activity: Not on file  Other Topics  Concern  . Not on file  Social History Narrative  . Not on file    FAMILY HISTORY: Family History  Problem Relation Age of Onset  . Cancer Father        kidney    ALLERGIES:  is allergic to ace inhibitors; chlorthalidone; and pravastatin.  MEDICATIONS:  Current Outpatient Medications  Medication Sig Dispense Refill  . acetaminophen (TYLENOL) 500 MG tablet Take 1,250 mg by mouth.    Marland Kitchen albuterol (ACCUNEB) 0.63 MG/3ML nebulizer solution Take 1 ampule by nebulization every 6 (six) hours as needed for wheezing.    Marland Kitchen albuterol (PROVENTIL HFA;VENTOLIN HFA) 108 (90 Base) MCG/ACT inhaler Inhale 2 puffs into the lungs every 6 (six) hours as needed for wheezing or shortness of breath.    Marland Kitchen amLODipine (NORVASC) 10 MG tablet Take 10 mg by mouth every morning.   0  . atorvastatin (LIPITOR) 20 MG tablet Take by mouth.    . budesonide-formoterol (SYMBICORT) 160-4.5 MCG/ACT inhaler Inhale into the lungs.    Marland Kitchen EPINEPHrine 0.3 mg/0.3 mL IJ SOAJ injection Inject into the muscle.    . fexofenadine (ALLEGRA) 180 MG tablet Take 180 mg by mouth daily.    . fluticasone (FLONASE) 50 MCG/ACT nasal spray  Place 1 spray into both nostrils daily.   3  . Glucos-Chondroit-Hyaluron-MSM (GLUCOSAMINE CHONDROITIN JOINT) TABS Take by mouth.    Marland Kitchen ipratropium (ATROVENT) 0.02 % nebulizer solution Inhale into the lungs.    . Loratadine 10 MG CAPS Take 10 mg by mouth.    . Multiple Vitamins-Minerals (CENTRUM SILVER PO) Take by mouth once.    . niacin 500 MG tablet Take 500 mg by mouth at bedtime.    . Omega-3 Fatty Acids (FISH OIL) 1000 MG CAPS Take by mouth once.    Marland Kitchen omeprazole (PRILOSEC) 40 MG capsule Take 1 capsule (40 mg total) by mouth daily. 90 capsule 3  . predniSONE (DELTASONE) 5 MG tablet Take 5 mg by mouth daily with breakfast.     No current facility-administered medications for this visit.      PHYSICAL EXAMINATION: ECOG PERFORMANCE STATUS: 1 - Symptomatic but completely ambulatory Vitals:   03/26/18  1549  BP: (!) 145/75  Pulse: 81  Temp: 98.5 F (36.9 C)   Filed Weights   03/26/18 1549  Weight: 203 lb 2 oz (92.1 kg)    Physical Exam  Constitutional: He is oriented to person, place, and time and well-developed, well-nourished, and in no distress. No distress.  HENT:  Head: Normocephalic and atraumatic.  Nose: Nose normal.  Mouth/Throat: Oropharynx is clear and moist. No oropharyngeal exudate.  Eyes: Pupils are equal, round, and reactive to light. Conjunctivae and EOM are normal. Left eye exhibits no discharge. No scleral icterus.  Neck: Normal range of motion. Neck supple. No JVD present.  Cardiovascular: Normal rate, regular rhythm and normal heart sounds. Exam reveals no friction rub.  No murmur heard. Pulmonary/Chest: Effort normal and breath sounds normal. No respiratory distress. He has no wheezes. He has no rales. He exhibits no tenderness.  Abdominal: Soft. Bowel sounds are normal. He exhibits no distension and no mass. There is no tenderness. There is no rebound and no guarding.  Musculoskeletal: Normal range of motion. He exhibits no edema or tenderness.  Lymphadenopathy:    He has no cervical adenopathy.  Neurological: He is alert and oriented to person, place, and time. No cranial nerve deficit. He exhibits normal muscle tone. Coordination normal.  Skin: Skin is warm and dry. No rash noted. He is not diaphoretic. No erythema.  Psychiatric: Affect and judgment normal.     LABORATORY DATA:  I have reviewed the data as listed CBC Latest Ref Rng & Units 03/24/2018 12/07/2017 11/02/2017  WBC 3.8 - 10.6 K/uL 6.6 11.2(H) 12.9(H)  Hemoglobin 13.0 - 18.0 g/dL 13.0 14.3 12.1(L)  Hematocrit 40.0 - 52.0 % 39.5(L) 42.9 38.1(L)  Platelets 150 - 440 K/uL 257 276 346   Reviewed patient's chemistry lab work that was done on 10/15/2017 at [place] [facility] system Normal kidney and liver function.    ASSESSMENT & PLAN:  1. Neuroendocrine carcinoma ([place])   2. Iron deficiency anemia,  unspecified iron deficiency anemia type    # Iron deficiency anemia: iron panel reviewed, iron deficiency worsened, ferritin decreased to 5, TSAT decreased to 5. Indicate ongoing blood loss.  Plan IV iron with Venofer 220m twice a week for 5 doses. Allergy reactions/infusion reaction including anaphylactic reaction discussed with patient. Patient voices understanding and willing to proceed.  # Neuroendocrine carcinoma: communicate with Gastroenterology for further evaluation with EGD.  All questions were answered. The patient knows to call the clinic with any problems questions or concerns.  Return of visit: 4 weeks to assess if any addition need  of venofer.   Thank you for this kind referral and the opportunity to participate in the care of this patient. A copy of today's note is routed to referring provider   Earlie Server, MD, PhD Hematology Oncology [facility] at [facility] Pager- [phone] 03/26/2018

## 2018-03-26 NOTE — Progress Notes (Signed)
Patient here today for follow up.   

## 2018-03-30 ENCOUNTER — Inpatient Hospital Stay: Payer: Medicare Other

## 2018-03-30 DIAGNOSIS — C7A8 Other malignant neuroendocrine tumors: Secondary | ICD-10-CM | POA: Diagnosis not present

## 2018-03-30 DIAGNOSIS — D509 Iron deficiency anemia, unspecified: Secondary | ICD-10-CM

## 2018-03-30 MED ORDER — IRON SUCROSE 20 MG/ML IV SOLN
200.0000 mg | INTRAVENOUS | Status: DC
  Administered 2018-03-30: 200 mg via INTRAVENOUS
  Filled 2018-03-30: qty 10

## 2018-03-30 MED ORDER — SODIUM CHLORIDE 0.9 % IV SOLN
INTRAVENOUS | Status: DC
  Administered 2018-03-30: 14:00:00 via INTRAVENOUS
  Filled 2018-03-30: qty 1000

## 2018-04-02 ENCOUNTER — Inpatient Hospital Stay: Payer: Medicare Other

## 2018-04-02 VITALS — BP 141/82 | HR 89 | Temp 97.3°F | Resp 18

## 2018-04-02 DIAGNOSIS — C7A8 Other malignant neuroendocrine tumors: Secondary | ICD-10-CM | POA: Diagnosis not present

## 2018-04-02 DIAGNOSIS — D509 Iron deficiency anemia, unspecified: Secondary | ICD-10-CM

## 2018-04-02 MED ORDER — SODIUM CHLORIDE 0.9 % IV SOLN
INTRAVENOUS | Status: DC
  Administered 2018-04-02: 14:00:00 via INTRAVENOUS
  Filled 2018-04-02: qty 1000

## 2018-04-02 MED ORDER — IRON SUCROSE 20 MG/ML IV SOLN
200.0000 mg | INTRAVENOUS | Status: DC
Start: 2018-04-02 — End: 2018-04-02
  Administered 2018-04-02: 200 mg via INTRAVENOUS
  Filled 2018-04-02: qty 10

## 2018-04-06 ENCOUNTER — Inpatient Hospital Stay: Payer: Medicare Other

## 2018-04-06 VITALS — BP 128/80 | HR 82 | Temp 96.9°F | Resp 18

## 2018-04-06 DIAGNOSIS — D509 Iron deficiency anemia, unspecified: Secondary | ICD-10-CM

## 2018-04-06 DIAGNOSIS — C7A8 Other malignant neuroendocrine tumors: Secondary | ICD-10-CM | POA: Diagnosis not present

## 2018-04-06 MED ORDER — SODIUM CHLORIDE 0.9 % IV SOLN
Freq: Once | INTRAVENOUS | Status: AC
  Administered 2018-04-06: 13:00:00 via INTRAVENOUS
  Filled 2018-04-06: qty 1000

## 2018-04-06 MED ORDER — IRON SUCROSE 20 MG/ML IV SOLN
200.0000 mg | INTRAVENOUS | Status: DC
  Administered 2018-04-06: 200 mg via INTRAVENOUS
  Filled 2018-04-06: qty 10

## 2018-04-09 ENCOUNTER — Inpatient Hospital Stay: Payer: Medicare Other

## 2018-04-09 VITALS — BP 108/70 | HR 71 | Temp 98.7°F | Resp 18

## 2018-04-09 DIAGNOSIS — C7A8 Other malignant neuroendocrine tumors: Secondary | ICD-10-CM | POA: Diagnosis not present

## 2018-04-09 DIAGNOSIS — D509 Iron deficiency anemia, unspecified: Secondary | ICD-10-CM

## 2018-04-09 MED ORDER — IRON SUCROSE 20 MG/ML IV SOLN
200.0000 mg | INTRAVENOUS | Status: DC
Start: 2018-04-09 — End: 2018-04-09
  Administered 2018-04-09: 200 mg via INTRAVENOUS
  Filled 2018-04-09: qty 10

## 2018-04-09 MED ORDER — SODIUM CHLORIDE 0.9 % IV SOLN
Freq: Once | INTRAVENOUS | Status: AC
  Administered 2018-04-09: 14:00:00 via INTRAVENOUS
  Filled 2018-04-09: qty 1000

## 2018-04-15 ENCOUNTER — Encounter: Payer: Self-pay | Admitting: Gastroenterology

## 2018-04-15 ENCOUNTER — Ambulatory Visit (INDEPENDENT_AMBULATORY_CARE_PROVIDER_SITE_OTHER): Payer: Medicare Other | Admitting: Gastroenterology

## 2018-04-15 VITALS — BP 133/84 | HR 76 | Ht 66.0 in | Wt 202.0 lb

## 2018-04-15 DIAGNOSIS — K2940 Chronic atrophic gastritis without bleeding: Secondary | ICD-10-CM

## 2018-04-15 DIAGNOSIS — C7A8 Other malignant neuroendocrine tumors: Secondary | ICD-10-CM | POA: Diagnosis not present

## 2018-04-15 DIAGNOSIS — D509 Iron deficiency anemia, unspecified: Secondary | ICD-10-CM

## 2018-04-15 NOTE — Progress Notes (Signed)
Jonathon Bellows MD, MRCP(U.K) [address]  [place]  [place], [place] [address]  Main: [phone]  Fax: [phone]   Primary Care Physician: Gayland Curry, MD  Primary Gastroenterologist:  Dr. Jonathon Bellows   Chief Complaint  Patient presents with  . Iron Defiency Anemia    HPI: Ronnie Reyes is a 67 y.o. male    Summary of history :  Ronnie Reyes a 67 y.o.y/ o maleinitially referred and seen on 11/11/17  for iron deficiency anemia.He follows with hematology oncology for anemia and sees Dr. Mamie Levers May 2018 the patient had a normal hemoglobin of 16 g, in December 2018 it had dropped to 11.7 g with an MCV of 80 with iron deficiency He received IV iron.A colonoscopy was in March 2017 by Dr. Bary Castilla and showed no polyps.No overt bleeding per history  11/11/17- Celiac serology-negative, H pylori stool antigen negative  11/19/17- EGD: A few gastric sessile polyps were seen at the antrum and biopsies suggested intestinal metaplasia and chronic gastritis, atrophic gastritis-negative for H pylori . A 8 mm polyp was also seen close by - I only took sample biopsies from the edge WELL-DIFFERENTIATED NEUROENDOCRINE TUMOR, 2 MM. - LYMPHOVASCULAR INVASION. - TUMOR EXTENDS TO THE DEEP BIOPSY BASE. G1, Well-differentiated neuroendocrine tumor Mitotic Rate: 1 mitoses / 2 mm2 ,Ki-67 Labeling Index: <1 % ,Tumor Extension: At least into the lamina propria ,Margins: Tumor extends to the deep biopsy base   Colonoscopy- 2 tubular adenomas were excised.  Interval history 11/27/2016 -04/15/18   10/2017 - Capsule study - poor prep and did not exit out 11/2017 Underwent EUS and EMR of gastric polyp- well differentiated NET 03/2018 Followed with Dr Tasia Catchings in oncology -concern for worsening of iron deficiency.Ferritin - 5 in 03/2018  11/27/17- Gastrin significantly elevated at 1364   Says he has occasional fevers and chills. No other abdominal pain or complaints.    Current Outpatient  Medications  Medication Sig Dispense Refill  . acetaminophen (TYLENOL) 500 MG tablet Take 1,250 mg by mouth.    Marland Kitchen albuterol (ACCUNEB) 0.63 MG/3ML nebulizer solution Take 1 ampule by nebulization every 6 (six) hours as needed for wheezing.    Marland Kitchen amLODipine (NORVASC) 10 MG tablet Take 10 mg by mouth every morning.   0  . atorvastatin (LIPITOR) 20 MG tablet Take by mouth.    . fexofenadine (ALLEGRA) 180 MG tablet Take 180 mg by mouth daily.    . fluticasone (FLONASE) 50 MCG/ACT nasal spray Place 1 spray into both nostrils daily.   3  . Fluticasone-Salmeterol (ADVAIR DISKUS) 500-50 MCG/DOSE AEPB Inhale into the lungs.    . Glucos-Chondroit-Hyaluron-MSM (GLUCOSAMINE CHONDROITIN JOINT) TABS Take by mouth.    Marland Kitchen ipratropium (ATROVENT) 0.02 % nebulizer solution Inhale into the lungs.    . Loratadine 10 MG CAPS Take 10 mg by mouth.    . Multiple Vitamins-Minerals (CENTRUM SILVER PO) Take by mouth once.    . niacin 500 MG tablet Take 500 mg by mouth at bedtime.    . Omega-3 Fatty Acids (FISH OIL) 1000 MG CAPS Take by mouth once.    . budesonide-formoterol (SYMBICORT) 160-4.5 MCG/ACT inhaler Inhale into the lungs.    Marland Kitchen EPINEPHrine 0.3 mg/0.3 mL IJ SOAJ injection Inject into the muscle.    Marland Kitchen omeprazole (PRILOSEC) 40 MG capsule Take 1 capsule (40 mg total) by mouth daily. (Patient not taking: Reported on 04/15/2018) 90 capsule 3  . predniSONE (DELTASONE) 5 MG tablet Take 5 mg by mouth daily with  breakfast.     No current facility-administered medications for this visit.     Allergies as of 04/15/2018 - Review Complete 04/15/2018  Allergen Reaction Noted  . Ace inhibitors Shortness Of Breath 08/28/2015  . Chlorthalidone Anaphylaxis 08/28/2015  . Pravastatin Rash 04/02/2017    ROS:  General: Negative for anorexia, weight loss, fever, chills, fatigue, weakness. ENT: Negative for hoarseness, difficulty swallowing , nasal congestion. CV: Negative for chest pain, angina, palpitations, dyspnea on  exertion, peripheral edema.  Respiratory: Negative for dyspnea at rest, dyspnea on exertion, cough, sputum, wheezing.  GI: See history of present illness. GU:  Negative for dysuria, hematuria, urinary incontinence, urinary frequency, nocturnal urination.  Endo: Negative for unusual weight change.    Physical Examination:   BP 133/84   Pulse 76   Ht 5' 6"  (1.676 m)   Wt 202 lb (91.6 kg)   BMI 32.60 kg/m   General: Well-nourished, well-developed in no acute distress.  Eyes: No icterus. Conjunctivae pink. Mouth: Oropharyngeal mucosa moist and pink , no lesions erythema or exudate. Lungs: Clear to auscultation bilaterally. Non-labored. Heart: Regular rate and rhythm, no murmurs rubs or gallops.  Abdomen: Bowel sounds are normal, nontender, nondistended, no hepatosplenomegaly or masses, no abdominal bruits or hernia , no rebound or guarding.   Extremities: No lower extremity edema. No clubbing or deformities. Neuro: Alert and oriented x 3.  Grossly intact. Skin: Warm and dry, no jaundice.   Psych: Alert and cooperative, normal mood and affect.   Imaging Studies: No results found.  Assessment and Plan:   Ronnie Reyes is a 67 y.o. y/o male  here to follow up for iron deficiency anemia and NET 1. . 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 Likely atrophic gastritis cause for impaired iron absorption. 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 Elevated Gastrin, NET is Type 1 associated with atrophic gastritis and since <1 cm usually has good prognosis . S/p EMR and resected of [facility] at [facility] in 11/2017 .Still has very low ferritin. No evidence of overt blood loss.    Plan : 1. Capsule study of the small bowel since last was a poor prep and needs 12 hour duration 2. Atrophic gastritis likely cause of  iron deficiency anemia- continue iv iron  3. Repeat EGD for Gastric mapping in view of intestinal metaplasia as well as evaluation of recent EMR site where NET resected.  4. Colonoscopy in 5 years due to tubular adenomas x 2 5. PET scan showed no tumors in the stomach but nodule in the lung neds follow up CT scan , I will get in touch with Dr Tasia Catchings.  6. Will plan for EGD+ capsule study on the same day   I have discussed alternative options, risks & benefits,  which include, but are not limited to, bleeding, infection, perforation,respiratory complication & drug reaction.  The patient agrees with this plan & written consent will be obtained.    Risks, benefits, alternatives of Givens capsule discussed with patient to include but not limited to the rare risk of Given's capsule becoming lodged in the GI tract requiring surgical removal.  The patient agrees with this plan & consent will be obtained.  Dr Jonathon Bellows  MD,MRCP [facility]) Follow up in 3 months

## 2018-04-21 ENCOUNTER — Inpatient Hospital Stay: Payer: Medicare Other | Attending: Oncology

## 2018-04-21 DIAGNOSIS — Z85020 Personal history of malignant carcinoid tumor of stomach: Secondary | ICD-10-CM | POA: Diagnosis not present

## 2018-04-21 DIAGNOSIS — D509 Iron deficiency anemia, unspecified: Secondary | ICD-10-CM | POA: Diagnosis present

## 2018-04-21 LAB — CBC WITH DIFFERENTIAL/PLATELET
BASOS ABS: 0.1 10*3/uL (ref 0–0.1)
Basophils Relative: 1 %
EOS ABS: 0.2 10*3/uL (ref 0–0.7)
Eosinophils Relative: 2 %
HCT: 44.4 % (ref 40.0–52.0)
HEMOGLOBIN: 14.9 g/dL (ref 13.0–18.0)
LYMPHS ABS: 1.8 10*3/uL (ref 1.0–3.6)
Lymphocytes Relative: 24 %
MCH: 27.8 pg (ref 26.0–34.0)
MCHC: 33.5 g/dL (ref 32.0–36.0)
MCV: 83 fL (ref 80.0–100.0)
Monocytes Absolute: 0.4 10*3/uL (ref 0.2–1.0)
Monocytes Relative: 6 %
NEUTROS PCT: 67 %
Neutro Abs: 5.3 10*3/uL (ref 1.4–6.5)
PLATELETS: 261 10*3/uL (ref 150–440)
RBC: 5.34 MIL/uL (ref 4.40–5.90)
RDW: 19.4 % — ABNORMAL HIGH (ref 11.5–14.5)
WBC: 7.8 10*3/uL (ref 3.8–10.6)

## 2018-04-21 LAB — IRON AND TIBC
Iron: 94 ug/dL (ref 45–182)
Saturation Ratios: 28 % (ref 17.9–39.5)
TIBC: 341 ug/dL (ref 250–450)
UIBC: 248 ug/dL

## 2018-04-21 LAB — FERRITIN: Ferritin: 93 ng/mL (ref 24–336)

## 2018-04-23 ENCOUNTER — Other Ambulatory Visit: Payer: Medicare Other

## 2018-04-26 ENCOUNTER — Encounter: Payer: Self-pay | Admitting: Oncology

## 2018-04-26 ENCOUNTER — Inpatient Hospital Stay: Payer: Medicare Other

## 2018-04-26 ENCOUNTER — Inpatient Hospital Stay: Payer: Medicare Other | Admitting: Oncology

## 2018-04-26 VITALS — BP 149/96 | HR 84 | Temp 97.5°F | Resp 18 | Wt 201.3 lb

## 2018-04-26 DIAGNOSIS — R911 Solitary pulmonary nodule: Secondary | ICD-10-CM

## 2018-04-26 DIAGNOSIS — Z85020 Personal history of malignant carcinoid tumor of stomach: Secondary | ICD-10-CM | POA: Diagnosis not present

## 2018-04-26 DIAGNOSIS — D509 Iron deficiency anemia, unspecified: Secondary | ICD-10-CM

## 2018-04-26 DIAGNOSIS — C7A8 Other malignant neuroendocrine tumors: Secondary | ICD-10-CM

## 2018-04-26 NOTE — Progress Notes (Signed)
Hematology/Oncology Follow up note [facility] Telephone:(336) [phone] Fax:(336) [phone]   Patient Care Team: Gayland Curry, MD as PCP - General (Family Medicine) Bary Castilla, Forest Gleason, MD (General Surgery)  REFERRING PROVIDER: Gayland Curry, MD REASON FOR VISIT Follow up for management of neuroendocrine carcinoma, iron deficiency anemia and lung nodule.   HISTORY OF PRESENTING ILLNESS:  Ronnie Reyes is a  67 y.o.  male with PMH listed below who was referred to me for evaluation of anemia. Patient follows up with primary care physician and has labs done at her PCPs office. I reviewed patient's lab work from [place] which showed on 10/15/2017, patient had a hemoglobin of 11.7, this was significantly reduced from the lab value of hemoglobin 16 on 03/03/2017.  # Iron deficiency anemia s/p IV iron.  # Referred to GI for work up. 11/19/2017 EGD showed - Normal examined duodenum.- Normal esophagus.- A few gastric polyps. Biopsied.- A single gastric polyp. Biopsied. 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 Large inflamed polyp revealed well differentiated neuroendocrine tumor, 64m, +LVI, tumor extends to the deep biopsy base. pT1pNx  Histologic Type and Grade: G1, Well-differentiated neuroendocrine tumor  Mitotic Rate: 1 mitoses / 2 mm2  Ki-67 Labeling Index: <1 %  # Colonoscopy showed colon polyps with were snared and pathology revealed tubular adenoma negative for high grade dysplasia and malignancy.   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   INTERVAL HISTORY Ronnie HUCKINSis a 67y.o. male who has above history reviewed by me today presents for follow up visit for management of low grade neuroendocrine tumor, iron deficiency.  During the interval he has received weekly  Vendor x 4.  Feels fatigue is a lot better.  Asthma, intermittent wheezing which is a chronic problem for him.  Denies any skin flush, diarrhea, abdominal pain.  He was recently see by GI Dr.Anna.   Review of Systems  Constitutional: Negative for chills, fever, malaise/fatigue and weight loss.  HENT: Negative for congestion, ear discharge, ear pain, hearing loss, nosebleeds, sinus pain and sore throat.   Eyes: Negative for double vision, photophobia, pain, discharge and redness.  Respiratory: Positive for wheezing. Negative for cough, hemoptysis, sputum production and shortness of breath.   Cardiovascular: Negative for chest pain, palpitations, orthopnea, claudication and leg swelling.  Gastrointestinal: Negative for abdominal pain, blood in stool, constipation, diarrhea, heartburn, melena, nausea and vomiting.  Genitourinary: Negative for dysuria, flank pain, frequency, hematuria and urgency.  Musculoskeletal: Negative for back pain, myalgias and neck pain.  Skin: Negative for itching and rash.  Neurological: Negative for dizziness, tingling, tremors, focal weakness, weakness and headaches.  Endo/Heme/Allergies: Negative for environmental allergies. Does not bruise/bleed easily.  Psychiatric/Behavioral: Negative for depression, hallucinations and substance abuse. The patient is not nervous/anxious.     MEDICAL HISTORY:  Past Medical History:  Diagnosis Date  . Anemia   . Asthma   . [place]   skin cancer/arm and back  . Complication of anesthesia    HARD TIME WAKING UP AFTER  APPENDECTOMY  . GERD (gastroesophageal reflux disease)   . Hernia   . Hypertension 2005   since 2005    SURGICAL HISTORY: Past Surgical History:  Procedure Laterality Date  . ANAL FISTULOTOMY N/A 01/02/2016   Procedure: ANAL FISTULOTOMY;  Surgeon: JRobert Bellow MD;  Location: [facility] ORS;  Service: General;  Laterality:  N/A;  . APPENDECTOMY  2000  . CHOLECYSTECTOMY  2006  . COLONOSCOPY   [id number]   small serrated adenoma removed from the rectum at 15cm. This was his first screening colonoscopy  . COLONOSCOPY WITH PROPOFOL N/A 01/02/2016   Procedure: COLONOSCOPY WITH PROPOFOL;  Surgeon: Robert Bellow, MD;  Location[facility] END[facility]COPY;  Service: Endoscopy;  Laterality: N/A;  . COLONOSCOPY WITH PROPOFOL N/A 11/19/2017   Procedure: COLONOSCOPY WITH PROPOFOL;  Surgeon: Jonathon Bellows, MD;  Location: [facility] ENDOSCOPY;  Service: Gastroenterology;  Laterality: N/A;  . ESOPHAGOGASTRODUODENOSCOPY (EGD) WITH PROPOFOL N/A 11/19/2017   Procedure: ESOPHAGOGASTRODUODENOSCOPY (EGD) WITH PROPOFOL;  Surgeon: Jonathon Bellows, MD;  Location: [facility] ENDOSCOPY;  Service: Gastroenterology;  Laterality: N/A;  . ingrown toenail removal  1964  . NOSE SURGERY  1974    SOCIAL HISTORY: Social History   Socioeconomic History  . Marital status: Married    Spouse name: Not on file  . Number of children: Not on file  . Years of education: Not on file  . Highest education level: Not on file  Occupational History  . Not on file  Social Needs  . Financial resource strain: Not on file  . Food insecurity:    Worry: Not on file    Inability: Not on file  . Transportation needs:    Medical: Not on file    Non-medical: Not on file  Tobacco Use  . Smoking status: Never Smoker  . Smokeless tobacco: Never Used  Substance and Sexual Activity  . Alcohol use: No    Alcohol/week: 0.0 oz  . Drug use: No  . Sexual activity: Yes  Lifestyle  . Physical activity:    Days per week: Not on file    Minutes per session: Not on file  . Stress: Not on file  Relationships  . Social connections:    Talks on phone: Not on file    Gets together: Not on file    Attends religious service: Not on file    Active member of club or organization: Not on file    Attends meetings of clubs or organizations: Not on file    Relationship status: Not on file  . Intimate partner violence:    Fear of current or ex partner: Not on file      Emotionally abused: Not on file    Physically abused: Not on file    Forced sexual activity: Not on file  Other Topics Concern  . Not on file  Social History Narrative  . Not on file    FAMILY HISTORY: Family History  Problem Relation Age of Onset  . Cancer Father        kidney    ALLERGIES:  is allergic to ace inhibitors; chlorthalidone; and pravastatin.  MEDICATIONS:  Current Outpatient Medications  Medication Sig Dispense Refill  . acetaminophen (TYLENOL) 500 MG tablet Take 1,250 mg by mouth.    Marland Kitchen albuterol (ACCUNEB) 0.63 MG/3ML nebulizer solution Take 1 ampule by nebulization every 6 (six) hours as needed for wheezing.    Marland Kitchen amLODipine (NORVASC) 10 MG tablet Take 10 mg by mouth every morning.   0  . atorvastatin (LIPITOR) 20 MG tablet Take by mouth.    . fexofenadine (ALLEGRA) 180 MG tablet Take 180 mg by mouth daily.    . fluticasone (FLONASE) 50 MCG/ACT nasal spray Place 1 spray into both nostrils daily.   3  . Fluticasone-Salmeterol (ADVAIR DISKUS) 500-50 MCG/DOSE AEPB Inhale into the lungs.    . Glucos-Chondroit-Hyaluron-MSM (GLUCOSAMINE  CHONDROITIN JOINT) TABS Take by mouth.    Marland Kitchen ipratropium (ATROVENT) 0.02 % nebulizer solution Inhale into the lungs.    . Loratadine 10 MG CAPS Take 10 mg by mouth.    . Multiple Vitamins-Minerals (CENTRUM SILVER PO) Take by mouth once.    . niacin 500 MG tablet Take 500 mg by mouth at bedtime.    . Omega-3 Fatty Acids (FISH OIL) 1000 MG CAPS Take by mouth once.    . budesonide-formoterol (SYMBICORT) 160-4.5 MCG/ACT inhaler Inhale into the lungs.    Marland Kitchen omeprazole (PRILOSEC) 40 MG capsule Take 1 capsule (40 mg total) by mouth daily. (Patient not taking: Reported on 04/15/2018) 90 capsule 3   No current facility-administered medications for this visit.      PHYSICAL EXAMINATION: ECOG PERFORMANCE STATUS: 1 - Symptomatic but completely ambulatory Vitals:   04/26/18 1439 04/26/18 1500  BP: (!) 154/100 (!) 149/96  Pulse: 85 84  Resp:  18   Temp: (!) 97.5 F (36.4 C)    Filed Weights   04/26/18 1439  Weight: 201 lb 5 oz (91.3 kg)    Physical Exam  Constitutional: He is oriented to person, place, and time and well-developed, well-nourished, and in no distress. No distress.  HENT:  Head: Normocephalic and atraumatic.  Nose: Nose normal.  Mouth/Throat: Oropharynx is clear and moist. No oropharyngeal exudate.  Eyes: Pupils are equal, round, and reactive to light. Conjunctivae and EOM are normal. Left eye exhibits no discharge. No scleral icterus.  Neck: Normal range of motion. Neck supple. No JVD present.  Cardiovascular: Normal rate, regular rhythm and normal heart sounds. Exam reveals no friction rub.  No murmur heard. Pulmonary/Chest: Effort normal. No respiratory distress. He has wheezes. He has no rales. He exhibits no tenderness.  Abdominal: Soft. Bowel sounds are normal. He exhibits no distension and no mass. There is no tenderness. There is no rebound and no guarding.  Musculoskeletal: Normal range of motion. He exhibits no edema or tenderness.  Lymphadenopathy:    He has no cervical adenopathy.  Neurological: He is alert and oriented to person, place, and time. No cranial nerve deficit. He exhibits normal muscle tone. Coordination normal.  Skin: Skin is warm and dry. No rash noted. He is not diaphoretic. No erythema.  Psychiatric: Affect and judgment normal.     LABORATORY DATA:  I have reviewed the data as listed CBC Latest Ref Rng & Units 04/21/2018 03/24/2018 12/07/2017  WBC 3.8 - 10.6 K/uL 7.8 6.6 11.2(H)  Hemoglobin 13.0 - 18.0 g/dL 14.9 13.0 14.3  Hematocrit 40.0 - 52.0 % 44.4 39.5(L) 42.9  Platelets 150 - 440 K/uL 261 257 276   Reviewed patient's chemistry lab work that was done on 10/15/2017 at [facility] Normal kidney and liver function.    ASSESSMENT & PLAN:  1. Neuroendocrine carcinoma ([place])   2. Solitary pulmonary nodule   3. Iron deficiency anemia, unspecified iron deficiency  anemia type    # Iron deficiency anemia: iron panel reviewed and discussed with patient. Iron store has improved.  Continue follow up with GI to complete Upper endoscopy and capsule study.  Repeat iron ferritin TIBC at next visit.   # Neuroendocrine carcinoma  # Lung nodule: not avid on Dotatate PET, however size is 1.2cm, concerning. He is never smoker. Will repeat with CT chest wo contrast.  Discussed with patient that if lung nodule size is same or increased, will need further evaluation.   All questions were answered. The patient knows to  call the clinic with any problems questions or concerns.  Return of visit: 3 months.  Total face to face encounter time for this patient visit was 1mn. >50% of the time was  spent in counseling and coordination of care.    ZEarlie Server MD, PhD Hematology Oncology [facility]ura[facility] PittsburghPager- [phone]/05/2018

## 2018-04-26 NOTE — Progress Notes (Signed)
Pt reports not "feeling as good as after the first infusions".

## 2018-04-30 ENCOUNTER — Ambulatory Visit
Admission: RE | Admit: 2018-04-30 | Discharge: 2018-04-30 | Disposition: A | Discharge status: Home / Self Care | Payer: Medicare Other | Source: Ambulatory Visit | Attending: Oncology | Admitting: Oncology

## 2018-04-30 DIAGNOSIS — J479 Bronchiectasis, uncomplicated: Secondary | ICD-10-CM | POA: Diagnosis not present

## 2018-04-30 DIAGNOSIS — R918 Other nonspecific abnormal finding of lung field: Secondary | ICD-10-CM | POA: Diagnosis not present

## 2018-04-30 DIAGNOSIS — I2510 Atherosclerotic heart disease of native coronary artery without angina pectoris: Secondary | ICD-10-CM | POA: Diagnosis not present

## 2018-04-30 DIAGNOSIS — I700 Atherosclerosis of aorta: Secondary | ICD-10-CM | POA: Diagnosis not present

## 2018-04-30 DIAGNOSIS — R911 Solitary pulmonary nodule: Secondary | ICD-10-CM | POA: Diagnosis present

## 2018-05-03 ENCOUNTER — Encounter: Payer: Self-pay | Admitting: *Deleted

## 2018-05-20 ENCOUNTER — Ambulatory Visit: Payer: Medicare Other | Admitting: Anesthesiology

## 2018-05-20 ENCOUNTER — Ambulatory Visit
Admission: RE | Admit: 2018-05-20 | Discharge: 2018-05-20 | Disposition: A | Discharge status: Home / Self Care | Payer: Medicare Other | Source: Ambulatory Visit | Attending: Gastroenterology | Admitting: Gastroenterology

## 2018-05-20 ENCOUNTER — Encounter: Payer: Self-pay | Admitting: *Deleted

## 2018-05-20 ENCOUNTER — Encounter
Admission: RE | Disposition: A | Discharge status: Home / Self Care | Payer: Self-pay | Source: Ambulatory Visit | Attending: Gastroenterology

## 2018-05-20 DIAGNOSIS — Z79899 Other long term (current) drug therapy: Secondary | ICD-10-CM | POA: Diagnosis not present

## 2018-05-20 DIAGNOSIS — I10 Essential (primary) hypertension: Secondary | ICD-10-CM | POA: Insufficient documentation

## 2018-05-20 DIAGNOSIS — B3781 Candidal esophagitis: Secondary | ICD-10-CM | POA: Insufficient documentation

## 2018-05-20 DIAGNOSIS — J45909 Unspecified asthma, uncomplicated: Secondary | ICD-10-CM | POA: Insufficient documentation

## 2018-05-20 DIAGNOSIS — K219 Gastro-esophageal reflux disease without esophagitis: Secondary | ICD-10-CM | POA: Insufficient documentation

## 2018-05-20 DIAGNOSIS — K2940 Chronic atrophic gastritis without bleeding: Secondary | ICD-10-CM

## 2018-05-20 DIAGNOSIS — D509 Iron deficiency anemia, unspecified: Secondary | ICD-10-CM | POA: Insufficient documentation

## 2018-05-20 DIAGNOSIS — Z85828 Personal history of other malignant neoplasm of skin: Secondary | ICD-10-CM | POA: Insufficient documentation

## 2018-05-20 HISTORY — PX: GIVENS CAPSULE STUDY: SHX5432

## 2018-05-20 HISTORY — PX: ESOPHAGOGASTRODUODENOSCOPY (EGD) WITH PROPOFOL: SHX5813

## 2018-05-20 SURGERY — IMAGING PROCEDURE, GI TRACT, INTRALUMINAL, VIA CAPSULE
Anesthesia: General

## 2018-05-20 MED ORDER — LIDOCAINE HCL (CARDIAC) PF 100 MG/5ML IV SOSY
PREFILLED_SYRINGE | INTRAVENOUS | Status: DC | PRN
  Administered 2018-05-20: 5 mL via INTRAVENOUS

## 2018-05-20 MED ORDER — SODIUM CHLORIDE 0.9 % IV SOLN
INTRAVENOUS | Status: DC
  Administered 2018-05-20: 1000 mL via INTRAVENOUS

## 2018-05-20 MED ORDER — IPRATROPIUM-ALBUTEROL 0.5-2.5 (3) MG/3ML IN SOLN
RESPIRATORY_TRACT | Status: AC
  Administered 2018-05-20: 3 mL
  Filled 2018-05-20: qty 3

## 2018-05-20 MED ORDER — LIDOCAINE HCL (PF) 2 % IJ SOLN
INTRAMUSCULAR | Status: AC
  Filled 2018-05-20: qty 10

## 2018-05-20 MED ORDER — IPRATROPIUM-ALBUTEROL 0.5-2.5 (3) MG/3ML IN SOLN: 3.0000 mL | Freq: Four times a day (QID) | RESPIRATORY_TRACT | Status: DC | PRN

## 2018-05-20 MED ORDER — PROPOFOL 10 MG/ML IV BOLUS
INTRAVENOUS | Status: AC
  Filled 2018-05-20: qty 20

## 2018-05-20 MED ORDER — PROPOFOL 10 MG/ML IV BOLUS
INTRAVENOUS | Status: DC | PRN
  Administered 2018-05-20 (×3): 50 mg via INTRAVENOUS

## 2018-05-20 MED ORDER — PROPOFOL 500 MG/50ML IV EMUL
INTRAVENOUS | Status: DC | PRN
  Administered 2018-05-20: 140 ug/kg/min via INTRAVENOUS

## 2018-05-20 MED ORDER — PROPOFOL 500 MG/50ML IV EMUL
INTRAVENOUS | Status: AC
  Filled 2018-05-20: qty 50

## 2018-05-20 NOTE — H&P (Signed)
Jonathon Bellows, MD [address], [place], [place], [place], [address] [address], [place], [place], [place], [address] Phone: [phone]  Fax: [phone]  Primary Care Physician:  Gayland Curry, MD   Pre-Procedure History & Physical: HPI:  Ronnie Reyes is a 67 y.o. male is here for an endoscopy    Past Medical History:  Diagnosis Date  . Anemia   . Asthma   . Cancer ([place]) 2003   skin cancer/arm and back  . Complication of anesthesia    HARD TIME WAKING UP AFTER  APPENDECTOMY  . GERD (gastroesophageal reflux disease)   . Hernia   . Hypertension 2005   since 2005    Past Surgical History:  Procedure Laterality Date  . ANAL FISTULOTOMY N/A 01/02/2016   Procedure: ANAL FISTULOTOMY;  Surgeon: Robert Bellow, MD;  Location: [facility] ORS;  Service: General;  Laterality: N/A;  . APPENDECTOMY  2000  . CHOLECYSTECTOMY  2006  . COLONOSCOPY  [id number]   small serrated adenoma removed from the rectum at 15cm. This was his first screening colonoscopy  . COLONOSCOPY WITH PROPOFOL N/A 01/02/2016   Procedure: COLONOSCOPY WITH PROPOFOL;  Surgeon: Robert Bellow, MD;  Location: [facility] ENDOSCOPY;  Service: Endoscopy;  Laterality: N/A;  . COLONOSCOPY WITH PROPOFOL N/A 11/19/2017   Procedure: COLONOSCOPY WITH PROPOFOL;  Surgeon: Jonathon Bellows, MD;  Location: [facility] ENDOSCOPY;  Service: Gastroenterology;  Laterality: N/A;  . ESOPHAGOGASTRODUODENOSCOPY (EGD) WITH PROPOFOL N/A 11/19/2017   Procedure: ESOPHAGOGASTRODUODENOSCOPY (EGD) WITH PROPOFOL;  Surgeon: Jonathon Bellows, MD;  Location: [facility] ENDOSCOPY;  Service: Gastroenterology;  Laterality: N/A;  . ingrown toenail removal  1964  . NOSE SURGERY  1974    Prior to Admission medications   Medication Sig Start Date End Date Taking? Authorizing Provider  acetaminophen (TYLENOL) 500 MG tablet Take 1,250 mg by mouth.   Yes [provider]  amLODipine (NORVASC) 10 MG tablet Take 10 mg by mouth every morning.  08/21/15  Yes [provider]  atorvastatin (LIPITOR) 20 MG tablet Take by mouth. 10/23/17 10/23/18 Yes [provider]  fexofenadine (ALLEGRA) 180 MG tablet Take 180 mg by mouth daily.   Yes [provider]  fluticasone (FLONASE) 50 MCG/ACT nasal spray Place 1 spray into both nostrils daily.  08/17/15  Yes [provider]  Fluticasone-Salmeterol (ADVAIR DISKUS) 500-50 MCG/DOSE AEPB Inhale into the lungs. 04/02/17  Yes [provider]  Glucos-Chondroit-Hyaluron-MSM (GLUCOSAMINE CHONDROITIN JOINT) TABS Take by mouth.   Yes [provider]  Multiple Vitamins-Minerals (CENTRUM SILVER PO) Take by mouth once.   Yes [provider]  niacin 500 MG tablet Take 500 mg by mouth at bedtime.   Yes [provider]  Omega-3 Fatty Acids (FISH OIL) 1000 MG CAPS Take by mouth once.   Yes [provider]  omeprazole (PRILOSEC) 40 MG capsule Take 1 capsule (40 mg total) by mouth daily. 11/11/17  Yes Jonathon Bellows, MD  albuterol (ACCUNEB) 0.63 MG/3ML nebulizer solution Take 1 ampule by nebulization every 6 (six) hours as needed for wheezing.    [provider]  budesonide-formoterol (SYMBICORT) 160-4.5 MCG/ACT inhaler Inhale into the lungs. 10/23/17 10/23/18  [provider]  ipratropium (ATROVENT) 0.02 % nebulizer solution Inhale into the lungs. 09/21/17 09/21/18  [provider]  Loratadine 10 MG CAPS Take 10 mg by mouth.    [provider]    Allergies as of 04/16/2018 - Review Complete 04/15/2018  Allergen Reaction Noted  . Ace inhibitors Shortness Of  Breath 08/28/2015  . Chlorthalidone Anaphylaxis 08/28/2015  . Pravastatin Rash 04/02/2017    Family History  Problem Relation Age of Onset  . Cancer Father        kidney    Social History   Socioeconomic History  . Marital status: Married    Spouse name: Not on file  . Number of children: Not on file  . Years of education: Not on file  . Highest education level: Not on  file  Occupational History  . Not on file  Social Needs  . Financial resource strain: Not on file  . Food insecurity:    Worry: Not on file    Inability: Not on file  . Transportation needs:    Medical: Not on file    Non-medical: Not on file  Tobacco Use  . Smoking status: Never Smoker  . Smokeless tobacco: Never Used  Substance and Sexual Activity  . Alcohol use: No    Alcohol/week: 0.0 oz  . Drug use: No  . Sexual activity: Yes  Lifestyle  . Physical activity:    Days per week: Not on file    Minutes per session: Not on file  . Stress: Not on file  Relationships  . Social connections:    Talks on phone: Not on file    Gets together: Not on file    Attends religious service: Not on file    Active member of club or organization: Not on file    Attends meetings of clubs or organizations: Not on file    Relationship status: Not on file  . Intimate partner violence:    Fear of current or ex partner: Not on file    Emotionally abused: Not on file    Physically abused: Not on file    Forced sexual activity: Not on file  Other Topics Concern  . Not on file  Social History Narrative  . Not on file    Review of Systems: See HPI, otherwise negative ROS  Physical Exam: BP (!) 151/94   Pulse 72   Temp (!) 96.9 F (36.1 C) (Tympanic)   Resp 18   Ht 5\' 6"  (1.676 m)   Wt 198 lb (89.8 kg)   SpO2 95%   BMI 31.96 kg/m  General:   Alert,  pleasant and cooperative in NAD Head:  Normocephalic and atraumatic. Neck:  Supple; no masses or thyromegaly. Lungs:  Clear throughout to auscultation, normal respiratory effort.    Heart:  +S1, +S2, Regular rate and rhythm, No edema. Abdomen:  Soft, nontender and nondistended. Normal bowel sounds, without guarding, and without rebound.   Neurologic:  Alert and  oriented x4;  grossly normal neurologically.  Impression/Plan: Ronnie Reyes is here for an endoscopy  to be performed for  evaluation of gastric tumor and placement of  capsule    Risks, benefits, limitations, and alternatives regarding endoscopy have been reviewed with the patient.  Questions have been answered.  All parties agreeable.   Jonathon Bellows, MD  05/20/2018, 7:55 AM

## 2018-05-20 NOTE — Transfer of Care (Signed)
Immediate Anesthesia Transfer of Care Note  Patient: Ronnie Reyes  Procedure(s) Performed: GIVENS CAPSULE STUDY x 12 HR (N/A ) ESOPHAGOGASTRODUODENOSCOPY (EGD) WITH PROPOFOL (N/A )  Patient Location: PACU  Anesthesia Type:General  Level of Consciousness: awake  Airway & Oxygen Therapy: Patient Spontanous Breathing  Post-op Assessment: Report given to RN  Post vital signs: Reviewed  Last Vitals:  Vitals Value Taken Time  BP 107/75 05/20/2018  8:33 AM  Temp    Pulse 74 05/20/2018  8:35 AM  Resp 14 05/20/2018  8:35 AM  SpO2 98 % 05/20/2018  8:35 AM  Vitals shown include unvalidated device data.  Last Pain:  Vitals:   05/20/18 0710  TempSrc: Tympanic  PainSc: 0-No pain         Complications: No apparent anesthesia complications

## 2018-05-20 NOTE — Op Note (Signed)
[facility] Gastroenterology Patient Name: Ronnie Reyes Procedure Date: 05/20/2018 7:52 AM MRN: [id number] Account #: [id number] Date of Birth: Apr 09, 1951 Admit Type: Outpatient Age: 67 Room: [facility] ENDO ROOM 3 Gender: Male Note Status: Finalized Procedure:            Upper GI endoscopy Indications:          Iron deficiency anemia Providers:            Jonathon Bellows MD, MD Referring MD:         Gayland Curry MD, MD (Referring MD) Medicines:            Monitored Anesthesia Care Complications:        No immediate complications. Procedure:            Pre-Anesthesia Assessment:                       - Prior to the procedure, a History and Physical was                        performed, and patient medications, allergies and                        sensitivities were reviewed. The patient's tolerance of                        previous anesthesia was reviewed.                       - The risks and benefits of the procedure and the                        sedation options and risks were discussed with the                        patient. All questions were answered and informed                        consent was obtained.                       - ASA Grade Assessment: III - A patient with severe                        systemic disease.                       After obtaining informed consent, the endoscope was                        passed under direct vision. Throughout the procedure,                        the patient's blood pressure, pulse, and oxygen                        saturations were monitored continuously. The Endoscope                        was introduced through the mouth, and advanced to the  third part of duodenum. The upper GI endoscopy was                        technically difficult and complex due to the patient's                        oxygen desaturation. The patient tolerated the                        procedure. Findings:  The examined duodenum was normal.      Patchy, white plaques were found in the entire esophagus.      The entire examined stomach was normal. Old clip from prior EMR site       noted I attempted to pass the capsule endoscopically, he had a lot of       laryngospasm and could not make the turn past the pharynx, decided to       not proceed Impression:           - Normal examined duodenum.                       - Esophageal plaques were found, suspicious for                        candidiasis.                       - Normal stomach.                       - No specimens collected. Recommendation:       - Discharge patient to home (with escort).                       - NPO for 5 hours.                       - Return to my office in 4 weeks.                       - 1. Suggest to swallow the capsule in the recovery                       2. Did not take any biopsies for gastric mapping today                        as the blood would confuse the pictures on the capsule                        study . Will need EGD+biopsies in 4-6 months Procedure Code(s):    --- Professional ---                       [phone], Esophagogastroduodenoscopy, flexible, transoral;                        diagnostic, including collection of specimen(s) by                        brushing or washing, when performed (separate procedure) Diagnosis Code(s):    --- Professional ---  K22.9, Disease of esophagus, unspecified                       D50.9, Iron deficiency anemia, unspecified CPT copyright 2017 American Medical Association. All rights reserved. The codes documented in this report are preliminary and upon coder review may  be revised to meet current compliance requirements. Jonathon Bellows, MD Jonathon Bellows MD, MD 05/20/2018 8:33:12 AM This report has been signed electronically. Number of Addenda: 0 Note Initiated On: 05/20/2018 7:52 AM      [facility]

## 2018-05-20 NOTE — Anesthesia Preprocedure Evaluation (Addendum)
Anesthesia Evaluation  Patient identified by MRN, date of birth, ID band Patient awake    Reviewed: Allergy & Precautions, H&P , NPO status , reviewed documented beta blocker date and time   History of Anesthesia Complications (+) history of anesthetic complications  Airway Mallampati: II  TM Distance: >3 FB Neck ROM: full    Dental  (+) Chipped, Missing, Caps   Pulmonary asthma ,    Pulmonary exam normal        Cardiovascular hypertension, Normal cardiovascular exam     Neuro/Psych    GI/Hepatic GERD  Medicated and Controlled,  Endo/Other    Renal/GU Renal disease     Musculoskeletal   Abdominal   Peds  Hematology  (+) anemia ,   Anesthesia Other Findings Past Medical History: No date: Anemia No date: Asthma 2003: Cancer ([place])     Comment:  skin cancer/arm and back No date: Complication of anesthesia     Comment:  HARD TIME WAKING UP AFTER  APPENDECTOMY No date: GERD (gastroesophageal reflux disease) No date: Hernia 2005: Hypertension     Comment:  since 2005  Past Surgical History: 01/02/2016: ANAL FISTULOTOMY; N/A     Comment:  Procedure: ANAL FISTULOTOMY;  Surgeon: Robert Bellow, MD;  Location: [facility] ORS;  Service: General;                Laterality: N/A; 2000: APPENDECTOMY 2006: CHOLECYSTECTOMY 6967,[id number]: COLONOSCOPY     Comment:  small serrated adenoma removed from the rectum at 15cm.               This was his first screening colonoscopy 01/02/2016: COLONOSCOPY WITH PROPOFOL; N/A     Comment:  Procedure: COLONOSCOPY WITH PROPOFOL;  Surgeon: Robert Bellow, MD;  Location: [facility] ENDOSCOPY;  Service:               Endoscopy;  Laterality: N/A; 11/19/2017: COLONOSCOPY WITH PROPOFOL; N/A     Comment:  Procedure: COLONOSCOPY WITH PROPOFOL;  Surgeon: Jonathon Bellows, MD;  Location[facility] ENDOSCOPY;  Service:               Gastroenterology;  Laterality:  N/A; 11/19/2017: ESOPHAGOGASTRODUODENOSCOPY (EGD) WITH PROPOFOL; N/A     Comment:  Procedure: ESOPHAGOGASTRODUODENOSCOPY (EGD) WITH               PROPOFOL;  Surgeon: Jonathon Bellows, MD;  Location: [facility]               ENDOSCOPY;  Service: Gastroenterology;  Laterality: N/A; 1964: ingrown toenail removal 1974: NOSE SURGERY  BMI    Body Mass Index:  31.96 kg/m      Reproductive/Obstetrics                            Anesthesia Physical Anesthesia Plan  ASA: III  Anesthesia Plan: General   Post-op Pain Management:    Induction: Intravenous  PONV Risk Score and Plan: 2 and Treatment may vary due to age or medical condition and TIVA  Airway Management Planned: Nasal Cannula and Natural Airway  Additional Equipment:   Intra-op Plan:   Post-operative Plan:   Informed Consent: I have reviewed the patients History and Physical, chart, labs and discussed the procedure  including the risks, benefits and alternatives for the proposed anesthesia with the patient or authorized representative who has indicated his/her understanding and acceptance.   Dental Advisory Given  Plan Discussed with:   Anesthesia Plan Comments:         Anesthesia Quick Evaluation

## 2018-05-20 NOTE — Anesthesia Postprocedure Evaluation (Signed)
Anesthesia Post Note  Patient: JARELL MCEWEN  Procedure(s) Performed: GIVENS CAPSULE STUDY x 12 HR (N/A ) ESOPHAGOGASTRODUODENOSCOPY (EGD) WITH PROPOFOL (N/A )  Patient location during evaluation: Endoscopy Anesthesia Type: General Level of consciousness: awake and alert Pain management: pain level controlled Vital Signs Assessment: post-procedure vital signs reviewed and stable Respiratory status: spontaneous breathing, nonlabored ventilation and respiratory function stable Cardiovascular status: blood pressure returned to baseline and stable Postop Assessment: no apparent nausea or vomiting Anesthetic complications: no     Last Vitals:  Vitals:   05/20/18 0840 05/20/18 0850  BP: 110/81 107/74  Pulse: 69 68  Resp: (!) 23 (!) 21  Temp:    SpO2: 96% 96%    Last Pain:  Vitals:   05/20/18 0830  TempSrc: Tympanic  PainSc:                  Alphonsus Sias

## 2018-05-20 NOTE — Anesthesia Post-op Follow-up Note (Signed)
Anesthesia QCDR form completed.        

## 2018-05-24 ENCOUNTER — Telehealth: Payer: Self-pay | Admitting: Gastroenterology

## 2018-05-24 NOTE — Telephone Encounter (Signed)
Pt wife left vm to see if results were back from capsule study Thursday

## 2018-05-25 NOTE — Telephone Encounter (Signed)
Patient has been notified that results have not yet come back from capsule study and he will be notified once results come back

## 2018-06-03 ENCOUNTER — Telehealth: Payer: Self-pay

## 2018-06-03 NOTE — Telephone Encounter (Signed)
Patients wife called to inquire on results from capsule study.  Please advise.  Thanks [employer]

## 2018-06-03 NOTE — Telephone Encounter (Signed)
LVM for pts wife to call office for results.  Thanks [employer]

## 2018-06-03 NOTE — Telephone Encounter (Signed)
Capsule has been read- bring report to office this afternoon

## 2018-06-03 NOTE — Telephone Encounter (Signed)
LVM for pts wife  to contact office to provide results of capsule endoscopy.  Thanks [employer]

## 2018-06-04 ENCOUNTER — Telehealth: Payer: Self-pay

## 2018-06-04 ENCOUNTER — Other Ambulatory Visit: Payer: Self-pay

## 2018-06-04 NOTE — Telephone Encounter (Signed)
Patient has been informed that his Capsule Endoscopy showed some mucosal inflammation and possible ulcerations and we will refer him to [facility] for second opinion.  Thanks [employer]

## 2018-06-04 NOTE — Telephone Encounter (Signed)
Received message patients wife called to go over results provided earlier today with her husband.  LVM for her to call back to discuss with her.  Thanks [employer]

## 2018-06-14 ENCOUNTER — Telehealth: Payer: Self-pay | Admitting: Gastroenterology

## 2018-06-14 ENCOUNTER — Telehealth: Payer: Self-pay

## 2018-06-14 NOTE — Telephone Encounter (Signed)
Patient's wife called and they can't get an appointment w/ [facility] until January of 2020.

## 2018-06-14 NOTE — Telephone Encounter (Signed)
Referral has been processed and in now "Ready To Schedule".  [facility] should be contacting patient this week.  Thanks [employer]

## 2018-06-14 NOTE — Telephone Encounter (Signed)
Heard a new doc at [facility] does this procedure can we try

## 2018-06-15 ENCOUNTER — Other Ambulatory Visit: Payer: Self-pay

## 2018-06-15 ENCOUNTER — Telehealth: Payer: Self-pay | Admitting: Internal Medicine

## 2018-06-15 DIAGNOSIS — C7A8 Other malignant neuroendocrine tumors: Secondary | ICD-10-CM

## 2018-06-15 NOTE — Telephone Encounter (Signed)
It appears 2nd opinion is in process for [facility] according to note yesterday stating referral at [facility] is "ready to schedule" Thus, I assume 2nd opinion here is no longer needed Advise if otherwise

## 2018-06-15 NOTE — Telephone Encounter (Signed)
See note below from Dr. Pyrtle 

## 2018-06-15 NOTE — Telephone Encounter (Signed)
Sent to AM DOD 06/15/18 Dr.Pyrtle to review and advise as to scheduling.

## 2018-06-15 NOTE — Telephone Encounter (Signed)
Dr. Hilarie Fredrickson please see below and advise regarding scheduling as DOD.

## 2018-06-15 NOTE — Telephone Encounter (Signed)
  Left voice message with patient and his wife to let them know a new referral has been initiated to [facility] at phone # [phone].  Referral Sent through epic.  Thanks [employer]

## 2018-06-16 ENCOUNTER — Telehealth: Payer: Self-pay

## 2018-06-16 ENCOUNTER — Encounter: Payer: Self-pay | Admitting: Gastroenterology

## 2018-06-16 NOTE — Telephone Encounter (Signed)
I spoke with Sharyn Lull at [facility]. She says that [facility] cannot see patient until 2020. Patient and referring MD do know that if patient is accepted that patient will be transferring care to our office.   Dr. Hilarie Fredrickson, do you accept this patient?

## 2018-06-16 NOTE — Telephone Encounter (Signed)
Contacted Mrs. Kley regarding her husbands referral to [facility].  Informed Mrs. Bucy that if Dr. Hilarie Fredrickson decides to take on care for his GI issues-he will become a patient of Dr. Garth Schlatter.  He will not be able to continue GI care with Dr. Vicente Males.    I also discussed with Dr. Vicente Males.  Dr. Vicente Males is completely fine with patients GI care being transferred to Dr. Hilarie Fredrickson as its in the best interest of the patients health.  Mrs. Israelson agrees to this as well.  And would like to move forward with referral process.  Thank you,  Sharyn Lull CMA

## 2018-06-16 NOTE — Telephone Encounter (Signed)
Dr. Hilarie Fredrickson recommends this pt be seen as a new pt with Dr. Rush Landmark. See referral message.

## 2018-06-16 NOTE — Telephone Encounter (Signed)
I recommended appointment with Dr. Rush Landmark given hx of gastric NET and possible need for EUS and deep enteroscopy

## 2018-06-18 NOTE — Telephone Encounter (Signed)
Patient has been scheduled to see Dr.Mansouraty on 07/05/18.

## 2018-07-05 ENCOUNTER — Ambulatory Visit: Payer: Medicare Other | Admitting: Gastroenterology

## 2018-07-08 ENCOUNTER — Telehealth: Payer: Self-pay | Admitting: Gastroenterology

## 2018-07-08 NOTE — Telephone Encounter (Signed)
Ronnie Reyes called to see if referral was sent out to [facility] or [facility], yet? Pls call once referral is ready.

## 2018-07-08 NOTE — Telephone Encounter (Signed)
[facility] Referral Update No appointments available.  Patient has been placed on wait list.  Thanks Sharyn Lull

## 2018-07-13 ENCOUNTER — Telehealth: Payer: Self-pay | Admitting: Gastroenterology

## 2018-07-13 NOTE — Telephone Encounter (Signed)
Pt is upset abt being wait listed. Pt would like a call back and help finding out what could be done sooner. [phone]

## 2018-07-14 NOTE — Telephone Encounter (Signed)
error 

## 2018-07-26 ENCOUNTER — Inpatient Hospital Stay: Payer: Medicare Other | Attending: Oncology

## 2018-07-26 ENCOUNTER — Other Ambulatory Visit: Payer: Self-pay

## 2018-07-26 ENCOUNTER — Other Ambulatory Visit: Payer: Self-pay | Admitting: Oncology

## 2018-07-26 DIAGNOSIS — R911 Solitary pulmonary nodule: Secondary | ICD-10-CM

## 2018-07-26 DIAGNOSIS — C7A8 Other malignant neuroendocrine tumors: Secondary | ICD-10-CM | POA: Diagnosis present

## 2018-07-26 DIAGNOSIS — D509 Iron deficiency anemia, unspecified: Secondary | ICD-10-CM | POA: Insufficient documentation

## 2018-07-26 DIAGNOSIS — J479 Bronchiectasis, uncomplicated: Secondary | ICD-10-CM | POA: Insufficient documentation

## 2018-07-26 LAB — CBC WITH DIFFERENTIAL/PLATELET
BASOS PCT: 1 %
Basophils Absolute: 0.1 10*3/uL (ref 0–0.1)
EOS ABS: 0.2 10*3/uL (ref 0–0.7)
Eosinophils Relative: 2 %
HCT: 46.2 % (ref 40.0–52.0)
Hemoglobin: 16.2 g/dL (ref 13.0–18.0)
Lymphocytes Relative: 29 %
Lymphs Abs: 2.4 10*3/uL (ref 1.0–3.6)
MCH: 31.4 pg (ref 26.0–34.0)
MCHC: 34.9 g/dL (ref 32.0–36.0)
MCV: 89.8 fL (ref 80.0–100.0)
Monocytes Absolute: 0.7 10*3/uL (ref 0.2–1.0)
Monocytes Relative: 8 %
Neutro Abs: 4.9 10*3/uL (ref 1.4–6.5)
Neutrophils Relative %: 60 %
PLATELETS: 220 10*3/uL (ref 150–440)
RBC: 5.15 MIL/uL (ref 4.40–5.90)
RDW: 14.2 % (ref 11.5–14.5)
WBC: 8.2 10*3/uL (ref 3.8–10.6)

## 2018-07-26 LAB — FERRITIN: FERRITIN: 37 ng/mL (ref 24–336)

## 2018-07-26 LAB — IRON AND TIBC
IRON: 76 ug/dL (ref 45–182)
Saturation Ratios: 20 % (ref 17.9–39.5)
TIBC: 386 ug/dL (ref 250–450)
UIBC: 310 ug/dL

## 2018-07-28 ENCOUNTER — Inpatient Hospital Stay: Payer: Medicare Other

## 2018-07-28 ENCOUNTER — Other Ambulatory Visit: Payer: Self-pay

## 2018-07-28 ENCOUNTER — Encounter: Payer: Self-pay | Admitting: Oncology

## 2018-07-28 ENCOUNTER — Inpatient Hospital Stay (HOSPITAL_BASED_OUTPATIENT_CLINIC_OR_DEPARTMENT_OTHER): Payer: Medicare Other | Admitting: Oncology

## 2018-07-28 VITALS — BP 143/84 | HR 92 | Temp 98.2°F | Resp 16 | Wt 205.5 lb

## 2018-07-28 DIAGNOSIS — J479 Bronchiectasis, uncomplicated: Secondary | ICD-10-CM | POA: Diagnosis not present

## 2018-07-28 DIAGNOSIS — D509 Iron deficiency anemia, unspecified: Secondary | ICD-10-CM | POA: Diagnosis not present

## 2018-07-28 DIAGNOSIS — C7A8 Other malignant neuroendocrine tumors: Secondary | ICD-10-CM

## 2018-07-28 NOTE — Progress Notes (Signed)
Patient here today for follow up.  Patient c/o new sores that has came up on his right shoulder and back of his right leg that will not heal, sores started about 1 month ago.

## 2018-07-28 NOTE — Progress Notes (Signed)
Hematology/Oncology Follow up note [facility] Telephone:([phone] Fax:([phone]) [phone]   Patient Care Team: Gayland Curry, MD as PCP - General (Family Medicine) Bary Castilla, Forest Gleason, MD (General Surgery)  REFERRING PROVIDER: Gayland Curry, MD REASON FOR VISIT Follow up for management of neuroendocrine carcinoma, iron deficiency anemia and lung nodule.   HISTORY OF PRESENTING ILLNESS:  Ronnie Reyes is a  67 y.o.  male with PMH listed below who was referred to me for evaluation of anemia. Patient follows up with primary care physician and has labs done at her PCPs office. I reviewed patient's lab work from [place] which showed on 10/15/2017, patient had a hemoglobin of 11.7, this was significantly reduced from the lab value of hemoglobin 16 on 03/03/2017.  # Iron deficiency anemia s/p IV iron.  # Referred to GI for work up. 11/19/2017 EGD showed - Normal examined duodenum.- Normal esophagus.- A few gastric polyps. Biopsied.- A single gastric polyp. Biopsied. 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 pT1pNx  Histologic Type and Grade: G1, Well-differentiated neuroendocrine tumor  Mitotic Rate: 1 mitoses / 2 mm2  Ki-67 Labeling Index: <1 %  # Colonoscopy showed colon polyps with were snared and pathology revealed tubular adenoma negative for high grade dysplasia and malignancy.   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   INTERVAL HISTORY Ronnie TANIMOTOis a 67y.o. male who has above history reviewed by me today present for follow-up visit for management of low-grade neuroendocrine tumor, iron deficiency anemia. He has previously received IV  iron. Reports fatigue is stable at baseline.  Patient had a capsule study on 06/02/2018 and repeat upper endoscopy on 05/20/2018. Capsule study showed area of mucosal inflammation and a possible ulceration in few locations.  Possible AVM with some bleeding.  Capsule did not exit the small bowel despite using 13-hour study.  It did exit the stomach within 90 minutes.  Prolonged small bowel transit.  [facility] seen in the setting of iron deficient anemia and NET type I of stomach.  May require balloon enteroscopy to evaluate further.  Patient reports that he has not established care yet. Patient denies seeing any blood in the stool, abdominal pain, shortness of breath   Review of Systems  Constitutional: Negative for chills, fever, malaise/fatigue and weight loss.  HENT: Negative for congestion, ear discharge, ear pain, hearing loss, nosebleeds, sinus pain and sore throat.   Eyes: Negative for double vision, photophobia, pain, discharge and redness.  Respiratory: Negative for cough, hemoptysis, sputum production, shortness of breath and wheezing.   Cardiovascular: Negative for chest pain, palpitations, orthopnea, claudication and leg swelling.  Gastrointestinal: Negative for abdominal pain, blood in stool, constipation, diarrhea, heartburn, melena, nausea and vomiting.  Genitourinary: Negative for dysuria, flank pain, frequency, hematuria and urgency.  Musculoskeletal: Negative for back pain, myalgias and neck pain.  Skin: Negative for itching and rash.  Neurological: Negative for dizziness, tingling, tremors, focal weakness, weakness and headaches.  Endo/Heme/Allergies: Negative for environmental allergies. Does not bruise/bleed easily.  Psychiatric/Behavioral: Negative for depression, hallucinations and substance abuse. The patient is not nervous/anxious.     MEDICAL HISTORY:  Past Medical History:  Diagnosis Date  . Anemia    . Asthma   .  Cancer ([place]) 2003   skin cancer/arm and back  . Complication of anesthesia    HARD TIME WAKING UP AFTER  APPENDECTOMY  . GERD (gastroesophageal reflux disease)   . Hernia   . Hypertension 2005   since 2005    SURGICAL HISTORY: Past Surgical History:  Procedure Laterality Date  . ANAL FISTULOTOMY N/A 01/02/2016   Procedure: ANAL FISTULOTOMY;  Surgeon: Robert Bellow, MD;  Location: [facility] ORS;  Service: General;  Laterality: N/A;  . APPENDECTOMY  2000  . CHOLECYSTECTOMY  2006  . COLONOSCOPY  [id number]   small serrated adenoma removed from the rectum at 15cm. This was his first screening colonoscopy  . COLONOSCOPY WITH PROPOFOL N/A 01/02/2016   Procedure: COLONOSCOPY WITH PROPOFOL;  Surgeon: Robert Bellow, MD;  Location: [facility] ENDOSCOPY;  Service: Endoscopy;  Laterality: N/A;  . COLONOSCOPY WITH PROPOFOL N/A 11/19/2017   Procedure: COLONOSCOPY WITH PROPOFOL;  Surgeon: Jonathon Bellows, MD;  Location: [facility] ENDOSCOPY;  Service: Gastroenterology;  Laterality: N/A;  . ESOPHAGOGASTRODUODENOSCOPY (EGD) WITH PROPOFOL N/A 11/19/2017   Procedure: ESOPHAGOGASTRODUODENOSCOPY (EGD) WITH PROPOFOL;  Surgeon: Jonathon Bellows, MD;  Location: [facility] ENDOSCOPY;  Service: Gastroenterology;  Laterality: N/A;  . ESOPHAGOGASTRODUODENOSCOPY (EGD) WITH PROPOFOL N/A 05/20/2018   Procedure: ESOPHAGOGASTRODUODENOSCOPY (EGD) WITH PROPOFOL;  Surgeon: Jonathon Bellows, MD;  Location: [facility] ENDOSCOPY;  Service: Gastroenterology;  Laterality: N/A;  . GIVENS CAPSULE STUDY N/A 05/20/2018   Procedure: GIVENS CAPSULE STUDY x 12 HR;  Surgeon: Jonathon Bellows, MD;  Location: [facility] ENDOSCOPY;  Service: Gastroenterology;  Laterality: N/A;  . ingrown toenail removal  1964  . NOSE SURGERY  1974    SOCIAL HISTORY: Social History   Socioeconomic History  . Marital status: Married    Spouse name: Not on file  . Number of children: Not on file  . Years of education: Not on file  . Highest education level: Not on file  Occupational  History  . Not on file  Social Needs  . Financial resource strain: Not on file  . Food insecurity:    Worry: Not on file    Inability: Not on file  . Transportation needs:    Medical: Not on file    Non-medical: Not on file  Tobacco Use  . Smoking status: Never Smoker  . Smokeless tobacco: Never Used  Substance and Sexual Activity  . Alcohol use: No    Alcohol/week: 0.0 standard drinks  . Drug use: No  . Sexual activity: Yes  Lifestyle  . Physical activity:    Days per week: Not on file    Minutes per session: Not on file  . Stress: Not on file  Relationships  . Social connections:    Talks on phone: Not on file    Gets together: Not on file    Attends religious service: Not on file    Active member of club or organization: Not on file    Attends meetings of clubs or organizations: Not on file    Relationship status: Not on file  . Intimate partner violence:    Fear of current or ex partner: Not on file    Emotionally abused: Not on file    Physically abused: Not on file    Forced sexual activity: Not on file  Other Topics Concern  . Not on file  Social History Narrative  . Not on file    FAMILY HISTORY: Family History  Problem Relation Age of Onset  . Cancer Father        kidney  ALLERGIES:  is allergic to ace inhibitors; chlorthalidone; and pravastatin.  MEDICATIONS:  Current Outpatient Medications  Medication Sig Dispense Refill  . acetaminophen (TYLENOL) 500 MG tablet Take 1,250 mg by mouth.    Marland Kitchen albuterol (ACCUNEB) 0.63 MG/3ML nebulizer solution Take 1 ampule by nebulization every 6 (six) hours as needed for wheezing.    Marland Kitchen amLODipine (NORVASC) 10 MG tablet Take 10 mg by mouth every morning.   0  . atorvastatin (LIPITOR) 20 MG tablet Take by mouth.    . budesonide-formoterol (SYMBICORT) 160-4.5 MCG/ACT inhaler Inhale into the lungs.    . fexofenadine (ALLEGRA) 180 MG tablet Take 180 mg by mouth daily.    . fluticasone (FLONASE) 50 MCG/ACT nasal spray  Place 1 spray into both nostrils daily.   3  . Fluticasone-Salmeterol (ADVAIR DISKUS) 500-50 MCG/DOSE AEPB Inhale into the lungs.    . Glucos-Chondroit-Hyaluron-MSM (GLUCOSAMINE CHONDROITIN JOINT) TABS Take by mouth.    Marland Kitchen ipratropium (ATROVENT) 0.02 % nebulizer solution Inhale into the lungs.    . Loratadine 10 MG CAPS Take 10 mg by mouth.    . Multiple Vitamins-Minerals (CENTRUM SILVER PO) Take by mouth once.    . niacin 500 MG tablet Take 500 mg by mouth at bedtime.    . Omega-3 Fatty Acids (FISH OIL) 1000 MG CAPS Take by mouth once.    Marland Kitchen omeprazole (PRILOSEC) 40 MG capsule Take 1 capsule (40 mg total) by mouth daily. 90 capsule 3   No current facility-administered medications for this visit.      PHYSICAL EXAMINATION: ECOG PERFORMANCE STATUS: 1 - Symptomatic but completely ambulatory Vitals:   07/28/18 1328  BP: (!) 143/84  Pulse: 92  Resp: 16  Temp: 98.2 F (36.8 C)   Filed Weights   07/28/18 1328  Weight: 205 lb 8 oz (93.2 kg)    Physical Exam  Constitutional: He is oriented to person, place, and time. No distress.  HENT:  Head: Normocephalic and atraumatic.  Nose: Nose normal.  Mouth/Throat: Oropharynx is clear and moist. No oropharyngeal exudate.  Eyes: Pupils are equal, round, and reactive to light. Conjunctivae and EOM are normal. Left eye exhibits no discharge. No scleral icterus.  Neck: Normal range of motion. Neck supple. No JVD present.  Cardiovascular: Normal rate, regular rhythm and normal heart sounds. Exam reveals no friction rub.  No murmur heard. Pulmonary/Chest: Effort normal. No respiratory distress. He has wheezes. He has no rales. He exhibits no tenderness.  Abdominal: Soft. Bowel sounds are normal. He exhibits no distension and no mass. There is no tenderness. There is no rebound and no guarding.  Musculoskeletal: Normal range of motion. He exhibits no edema or tenderness.  Lymphadenopathy:    He has no cervical adenopathy.  Neurological: He is  alert and oriented to person, place, and time. No cranial nerve deficit. He exhibits normal muscle tone. Coordination normal.  Skin: Skin is warm and dry. No rash noted. He is not diaphoretic. No erythema.  Psychiatric: Affect and judgment normal.     LABORATORY DATA:  I have reviewed the data as listed CBC Latest Ref Rng & Units 07/26/2018 04/21/2018 03/24/2018  WBC 3.8 - 10.6 K/uL 8.2 7.8 6.6  Hemoglobin 13.0 - 18.0 g/dL 16.2 14.9 13.0  Hematocrit 40.0 - 52.0 % 46.2 44.4 39.5(L)  Platelets 150 - 440 K/uL 220 261 257   Reviewed patient's chemistry lab work that was done on 10/15/2017 at [facility] Normal kidney and liver function.  RADIOGRAPHIC STUDIES: I have personally reviewed  the radiological images as listed and agreed with the findings in the report. 04/30/2018 CT chest without showed previously noted posterior right lower lobe lung nodule has resolved during interval.  Significant interval progression post inflammatory/infectious changes within both lower lobes and lingula with diffuse bronchiectasis.  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 ASSESSMENT & PLAN:  1. Iron deficiency anemia, unspecified iron deficiency anemia type   2. Neuroendocrine carcinoma ([place])   3. Occult blood positive stool   4. Bronchiectasis without complication ([place])    # Iron deficiency anemia: Iron panel reviewed and discussed with patient. Ferritin decreased to 37, saturation ratio 20.  TIBC 386.  Iron stores decreased from 3 months ago however still acceptable. Given that he has good hemoglobin level of 16.2.  We will hold additional IV iron. Encourage patient to follow-up with gastroenterology for further evaluation of the lesions that were noted on capsule study.  # Neuroendocrine carcinoma, check chromogranin A and 5-HIAA, vitamin B12 at next visit.  # Lung nodule: Resolved.   # Wheezing /chronic bilateral lower lobe bronchiectasis I have  previously referred patient to establish care with pulmonology.  Patient has not establish care.  Encourage patient to establish care with pulmonology for further evaluation.    We spent sufficient time to discuss many aspect of care, questions were answered to patient's satisfaction. The patient knows to call the clinic with any problems questions or concerns.  Return of visit: 3 months.  Total face to face encounter time for this patient visit was 25 min. >50% of the time was  spent in counseling and coordination of care.    Earlie Server, MD, PhD Hematology Oncology [facility] at [facility] Pager- [phone] 07/28/2018

## 2018-08-02 ENCOUNTER — Encounter

## 2018-08-02 ENCOUNTER — Ambulatory Visit: Payer: Medicare Other | Admitting: Gastroenterology

## 2018-08-23 ENCOUNTER — Telehealth: Payer: Self-pay | Admitting: Gastroenterology

## 2018-08-23 NOTE — Telephone Encounter (Signed)
Pt wife is calling  To received  A disc or thumb drive of capsule study she states [facility] had reached out to [place] on 07/29/2018  For this matter and pt needs to have this for apt tomorrow by 2:00pm please call pt  wife

## 2018-08-23 NOTE — Telephone Encounter (Signed)
Pt is calling to pick up a disc to take to [facility] for pt apt

## 2018-09-07 NOTE — Progress Notes (Signed)
09/08/18 1:58 PM   Ronnie Reyes 10/16/1951 [id number]  Referring provider: Gayland Curry, MD [address] [place], [place] [address]  Chief Complaint  Patient presents with  . Hydronephrosis    HPI: Ronnie Reyes is a 67 yo M who presents today for management and evaluation abnormal RUS of a left parapelvic cyst. The pt was referred to [place] by Gayland Curry, MD.   Patient has a known history of a left renal pelvic cyst.  He was referred for continuity of care by his primary care physician.  Previously followed by Dr. Bernardo Heater at [facility] last seen in 05/2018 for the same issue.  He is a known history of a left glomera of parapelvic cyst measuring 7 x 9.3 cm.    A 2010 CT imaging showed a left parapelvic cyst (5 cm), re-demonstrated on a recent PET CT from 3/19. CT from 2010 also showed mild dilation of the left collecting system, secondary mass effect.  Upper tract parenchyma is normal.  He recently underwent a follow-up renal ultrasound on 08/10/2018 which showed a large parapelvic cyst without concerning features as well as the possibility of left upper pole hydronephrosis.  Pt reports of low back pain and denies flank pain.   PMH: Past Medical History:  Diagnosis Date  . Anemia   . Asthma   . Cancer ([place]) 2003   skin cancer/arm and back  . Complication of anesthesia    HARD TIME WAKING UP AFTER  APPENDECTOMY  . GERD (gastroesophageal reflux disease)   . Hernia   . Hypertension 2005   since 2005    Surgical History: Past Surgical History:  Procedure Laterality Date  . ANAL FISTULOTOMY N/A 01/02/2016   Procedure: ANAL FISTULOTOMY;  Surgeon: Robert Bellow, MD;  Location: [facility] ORS;  Service: General;  Laterality: N/A;  . APPENDECTOMY  2000  . CHOLECYSTECTOMY  2006  . COLONOSCOPY  1962,2297   small serrated adenoma removed from the rectum at 15cm. This was his first screening colonoscopy  . COLONOSCOPY WITH PROPOFOL N/A 01/02/2016   Procedure:  COLONOSCOPY WITH PROPOFOL;  Surgeon: Robert Bellow, MD;  Location: [facility] ENDOSCOPY;  Service: Endoscopy;  Laterality: N/A;  . COLONOSCOPY WITH PROPOFOL N/A 11/19/2017   Procedure: COLONOSCOPY WITH PROPOFOL;  Surgeon: Jonathon Bellows, MD;  Location: [facility] ENDOSCOPY;  Service: Gastroenterology;  Laterality: N/A;  . ESOPHAGOGASTRODUODENOSCOPY (EGD) WITH PROPOFOL N/A 11/19/2017   Procedure: ESOPHAGOGASTRODUODENOSCOPY (EGD) WITH PROPOFOL;  Surgeon: Jonathon Bellows, MD;  Location: [facility] ENDOSCOPY;  Service: Gastroenterology;  Laterality: N/A;  . ESOPHAGOGASTRODUODENOSCOPY (EGD) WITH PROPOFOL N/A 05/20/2018   Procedure: ESOPHAGOGASTRODUODENOSCOPY (EGD) WITH PROPOFOL;  Surgeon: Jonathon Bellows, MD;  Location: [facility] ENDOSCOPY;  Service: Gastroenterology;  Laterality: N/A;  . GIVENS CAPSULE STUDY N/A 05/20/2018   Procedure: GIVENS CAPSULE STUDY x 12 HR;  Surgeon: Jonathon Bellows, MD;  Location: [facility] ENDOSCOPY;  Service: Gastroenterology;  Laterality: N/A;  . ingrown toenail removal  1964  . NOSE SURGERY  1974    Home Medications:  Allergies as of 09/08/2018      Reactions   Ace Inhibitors Shortness Of Breath   Chlorthalidone Anaphylaxis   Pravastatin Rash   Can take lovastatin 20      Medication List        Accurate as of 09/08/18  1:58 PM. Always use your most recent med list.          acetaminophen 500 MG tablet Commonly known as:  TYLENOL Take 1,250 mg by mouth.   albuterol  0.63 MG/3ML nebulizer solution Commonly known as:  ACCUNEB Take 1 ampule by nebulization every 6 (six) hours as needed for wheezing.   amLODipine 10 MG tablet Commonly known as:  NORVASC Take 10 mg by mouth every morning.   atorvastatin 20 MG tablet Commonly known as:  LIPITOR Take by mouth.   BREO ELLIPTA 100-25 MCG/INH Aepb Generic drug:  fluticasone furoate-vilanterol Inhale 1 puff into the lungs daily.   CENTRUM SILVER PO Take by mouth once.   fexofenadine 180 MG tablet Commonly known as:  ALLEGRA Take 180 mg by  mouth daily.   Fish Oil 1000 MG Caps Take by mouth once.   fluticasone 50 MCG/ACT nasal spray Commonly known as:  FLONASE Place 1 spray into both nostrils daily.   GLUCOSAMINE CHONDROITIN JOINT Tabs Take by mouth.   ipratropium 0.02 % nebulizer solution Commonly known as:  ATROVENT Inhale into the lungs.   Loratadine 10 MG Caps Take 10 mg by mouth.   niacin 500 MG tablet Take 500 mg by mouth at bedtime.   omeprazole 40 MG capsule Commonly known as:  PRILOSEC Take 1 capsule (40 mg total) by mouth daily.       Allergies:  Allergies  Allergen Reactions  . Ace Inhibitors Shortness Of Breath  . Chlorthalidone Anaphylaxis  . Pravastatin Rash    Can take lovastatin 20    Family History: Family History  Problem Relation Age of Onset  . Cancer Father        kidney    Social History:  reports that he has never smoked. He has never used smokeless tobacco. He reports that he does not drink alcohol or use drugs.  ROS: UROLOGY Frequent Urination?: No Hard to postpone urination?: No Burning/pain with urination?: No Get up at night to urinate?: Yes Leakage of urine?: No Urine stream starts and stops?: No Trouble starting stream?: No Do you have to strain to urinate?: No Blood in urine?: No Urinary tract infection?: No Sexually transmitted disease?: No Injury to kidneys or bladder?: No Painful intercourse?: No Weak stream?: No Erection problems?: No Penile pain?: No  Gastrointestinal Nausea?: No Vomiting?: No Indigestion/heartburn?: No Diarrhea?: No Constipation?: Yes  Constitutional Fever: No Night sweats?: No Weight loss?: No Fatigue?: No  Skin Skin rash/lesions?: No Itching?: No  Eyes Blurred vision?: No Double vision?: No  Ears/Nose/Throat Sore throat?: No Sinus problems?: No  Hematologic/Lymphatic Swollen glands?: No Easy bruising?: Yes  Cardiovascular Leg swelling?: No Chest pain?: No  Respiratory Cough?: No Shortness of  breath?: No  Endocrine Excessive thirst?: No  Musculoskeletal Back pain?: Yes Joint pain?: Yes  Neurological Headaches?: No Dizziness?: No  Psychologic Depression?: No Anxiety?: No  Physical Exam: BP 138/84   Pulse 90   Ht 5\' 7"  (1.702 m)   Wt 206 lb (93.4 kg)   BMI 32.26 kg/m   Constitutional:  Alert and oriented, No acute distress.  Accompanied  by wife today. HEENT: [place] AT, moist mucus membranes.  Trachea midline, no masses. Cardiovascular: No clubbing, cyanosis, or edema. Respiratory: Normal respiratory effort, no increased work of breathing. GI: Abdomen is soft, nontender, nondistended, no abdominal masses GU: No CVA tenderness Skin: No rashes, bruises or suspicious lesions. Neurologic: Grossly intact, no focal deficits, moving all 4 extremities. Psychiatric: Normal mood and affect.  Laboratory Data: 09/02/18, 0.9 Creatine   Pertinent Imaging: EXAM: Ultrasound of the kidneys and urinary bladder  INDICATION: 67 years old Male with recheck left renal cyst per urologist recommendation, N28.1 Cyst of kidney, acquired.  TECHNIQUE: Real time sonogram of both kidneys and urinary bladder was performed. Serial images were obtained in multiple planes.  COMPARISON: Abdomen CT 04/07/2017.  FINDINGS:   The right kidney measures 11.1 cm. There is no hydronephrosis. No cystic or solid mass is seen.    The left kidney measures 11.5 cm. There is evidence of at least mild hydronephrosis of the upper pole left kidney. There is a large peripelvic cyst involving the lower pole left kidney measuring approximately 9 x 7 x 6 cm. No solid mass identified.  Renal cortical thickness and echotexture are normal bilaterally. No perinephric abnormality is seen.  The urinary bladder is unremarkable. Left ureteral jet visualized. Right ureteral jet not visualized. Prevoid bladder volume 103 mL, postvoid 6 mL.  IMPRESSION:  There is evidence of hydronephrosis of the upper  pole left kidney and there is a large peripelvic cyst at the lower pole of the left kidney. Features appear similar to the prior CT. It is possible that what is felt to be hydronephrosis of the upper pole could represent peripelvic cysts, hydronephrosis favored. Of note the left ureteral jet is visualized in the bladder. There is no cortical thinning. The right kidney appears unremarkable.   Electronically Signed by: Lars Pinks, MD, [facility] Electronically Signed on: 08/10/2018 3:45 PM  Other Result Information  Interface, Rad Results In - 08/10/2018  3:46 PM EDT EXAM:  Ultrasound of the kidneys and urinary bladder  INDICATION: 67 years old Male with recheck left renal cyst per urologist recommendation, N28.1 Cyst of kidney, acquired.  TECHNIQUE:  Real time sonogram of both kidneys and urinary bladder was performed.  Serial images were obtained in multiple planes.  COMPARISON:  Abdomen CT 04/07/2017.  FINDINGS:    The right kidney measures 11.1 cm. There is no hydronephrosis.  No cystic or solid mass is seen.     The left kidney measures 11.5 cm. There is evidence of at least mild hydronephrosis of the upper pole left kidney. There is a large peripelvic cyst involving the lower pole left kidney measuring approximately 9 x 7 x 6 cm. No solid mass identified.  Renal cortical thickness and echotexture are normal bilaterally.  No perinephric abnormality is seen.  The urinary bladder is unremarkable.  Left ureteral jet visualized. Right ureteral jet not visualized. Prevoid bladder volume 103 mL, postvoid 6 mL.  IMPRESSION:  There is evidence of hydronephrosis of the upper pole left kidney and there is a large peripelvic cyst at the lower pole of the left kidney. Features appear similar to the prior CT. It is possible that what is felt to be hydronephrosis of the upper pole could represent peripelvic cysts, hydronephrosis favored. Of note the left ureteral jet is  visualized in the bladder. There is no cortical thinning. The right kidney appears unremarkable.   Electronically Signed by:  Lars Pinks, MD, [facility] Electronically Signed on:  08/10/2018 3:45 PM   RUS personally reviewed today and with the pt. was directly compared to previous scans including PET scan from 12/2017 as well as CT scan from 2010.  There is been interval progression in size of the conglomerate of parapelvic cyst with possible mild fullness of the left upper tract collecting system without parenchymal thinning.  Assessment & Plan:    1. Dx of Parapelvic Cyst  Conglomerate slowly enlarging, benign features, no need for further intervention  2. Possible left upper pole hydronephrosis  Possible mild upper pole hydrophnephrosis from mass effect  Stable since 2010, given  that he is asymptomatic, normal renal function, an no evidence of loss of parenchyma, would recommend surveillance/intervention Keep monitoring and do a RUS in 1 year  Return in about 1 year (around 09/09/2019) for RUS.  [facility] [address], [place], [place] [address] [phone]  I, Lucas Mallow, am acting as a scribe for Dr. Hollice Espy,  I have reviewed the above documentation for accuracy and completeness, and I agree with the above.   Hollice Espy, MD

## 2018-09-08 ENCOUNTER — Other Ambulatory Visit: Payer: Self-pay

## 2018-09-08 ENCOUNTER — Ambulatory Visit: Payer: Medicare Other | Admitting: Urology

## 2018-09-08 ENCOUNTER — Encounter: Payer: Self-pay | Admitting: Urology

## 2018-09-08 VITALS — BP 138/84 | HR 90 | Ht 67.0 in | Wt 206.0 lb

## 2018-09-08 DIAGNOSIS — N281 Cyst of kidney, acquired: Secondary | ICD-10-CM | POA: Diagnosis not present

## 2018-09-08 DIAGNOSIS — N1330 Unspecified hydronephrosis: Secondary | ICD-10-CM

## 2018-09-08 LAB — URINALYSIS, COMPLETE
Bilirubin, UA: NEGATIVE
Glucose, UA: NEGATIVE
Ketones, UA: NEGATIVE
Leukocytes, UA: NEGATIVE
Nitrite, UA: NEGATIVE
Protein, UA: NEGATIVE
RBC, UA: NEGATIVE
Specific Gravity, UA: 1.015 (ref 1.005–1.030)
Urobilinogen, Ur: 0.2 mg/dL (ref 0.2–1.0)
pH, UA: 6 (ref 5.0–7.5)

## 2018-10-16 IMAGING — CT CT CHEST W/O CM
2 of 4 series · 15 of 36 positions shown, 18 images · non-contrast
Comparison: PET-CT from 10/21/2017 .

CLINICAL DATA: Evaluate lung nodule.

EXAM:
CT CHEST WITHOUT CONTRAST
TECHNIQUE: Multidetector CT imaging of the chest was performed following the
standard protocol without IV contrast.

[Series 2: chest · axial · 0.66mm/px · z∈[-1229,-959]mm · 12 of 161 slices shown, 15 images (1 of 2)]
[im 13/161  mediastinal]
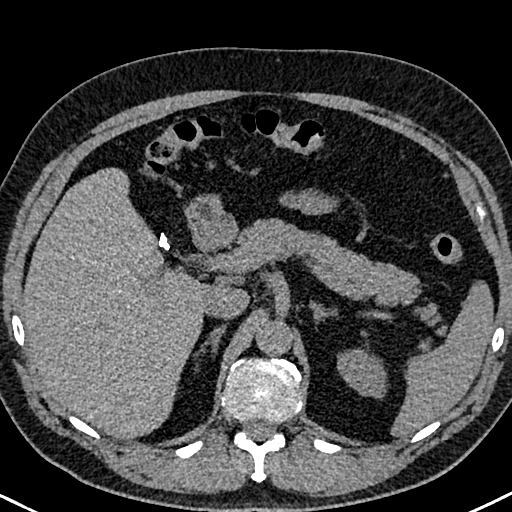
[im 13/161  lung]
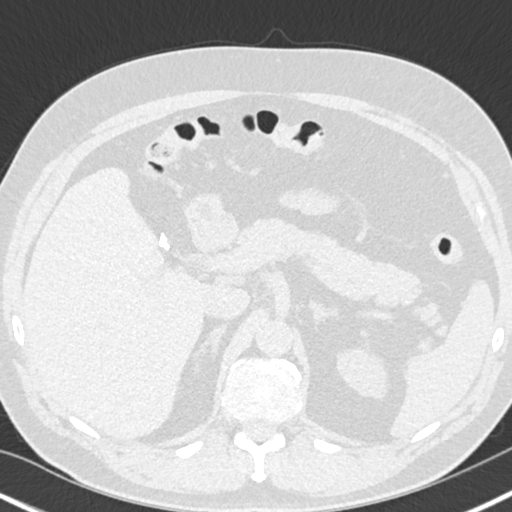
[im 25/161  lung]
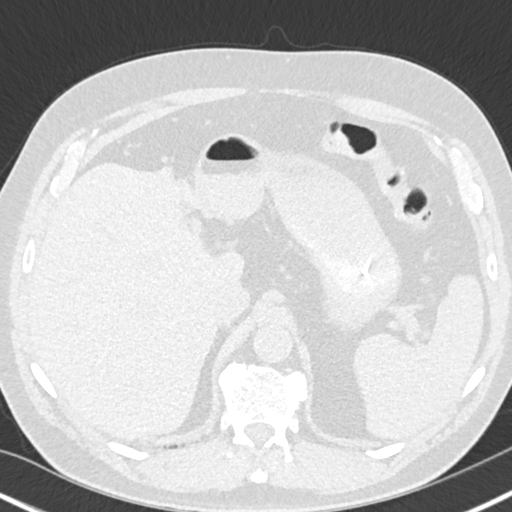
[im 37/161  lung]
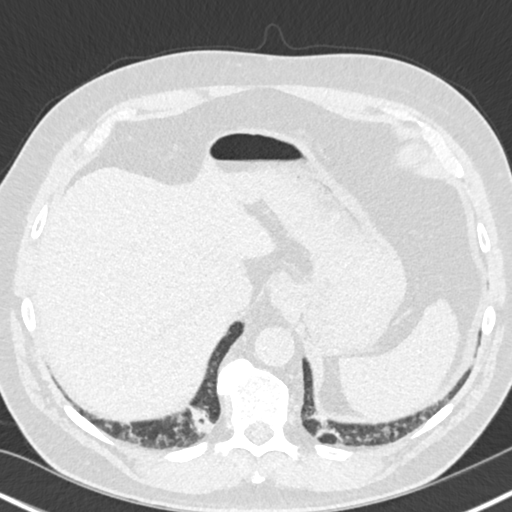
[im 50/161  lung]
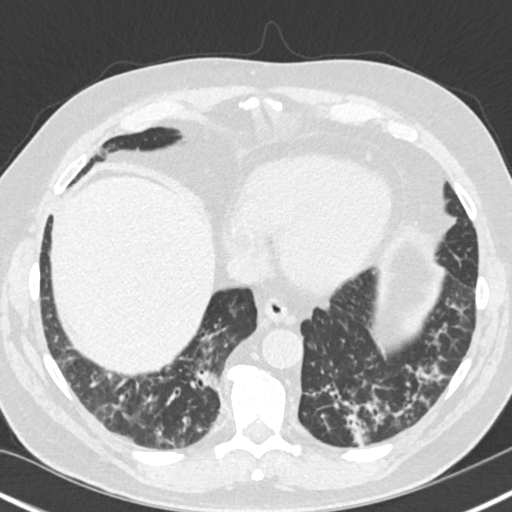
[im 62/161  mediastinal]
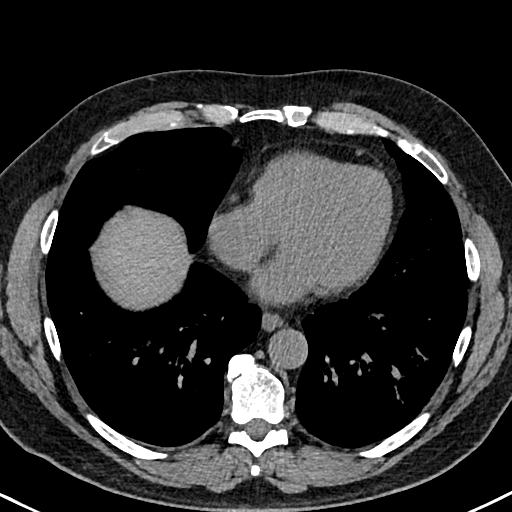
[im 62/161  lung]
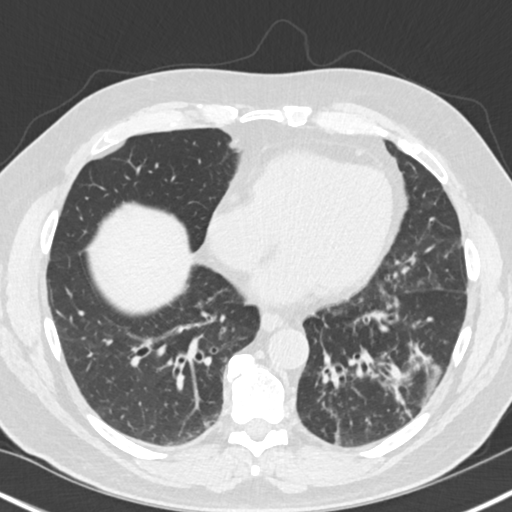
[im 74/161  lung]
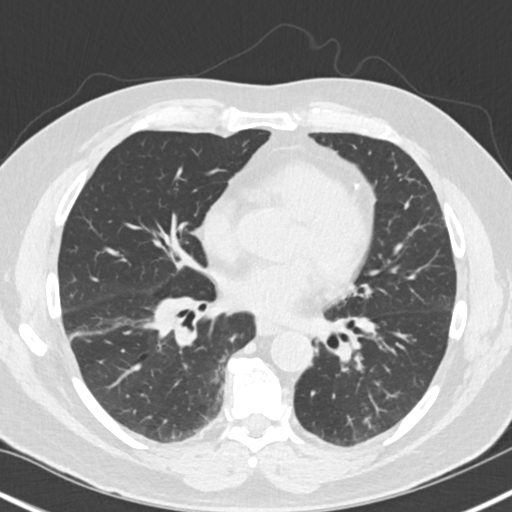
[im 87/161  lung]
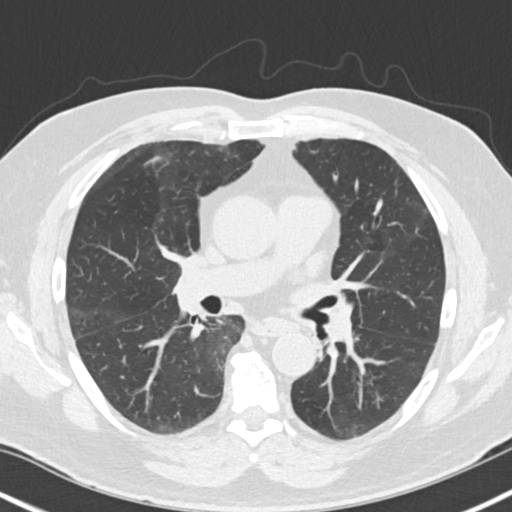
[im 99/161  lung]
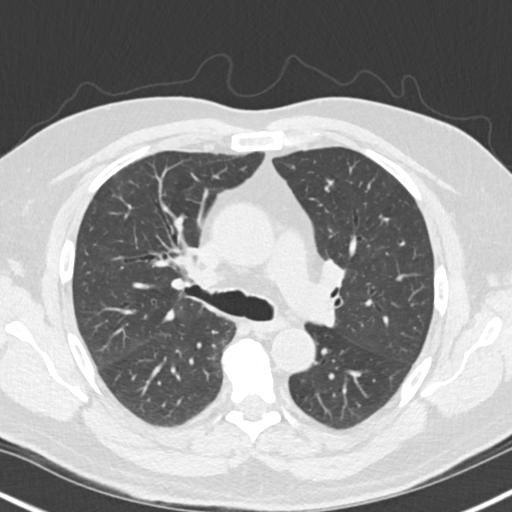
[im 111/161  mediastinal]
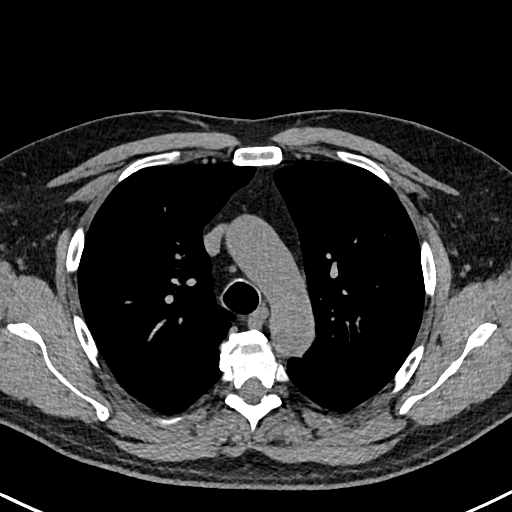
[im 111/161  lung]
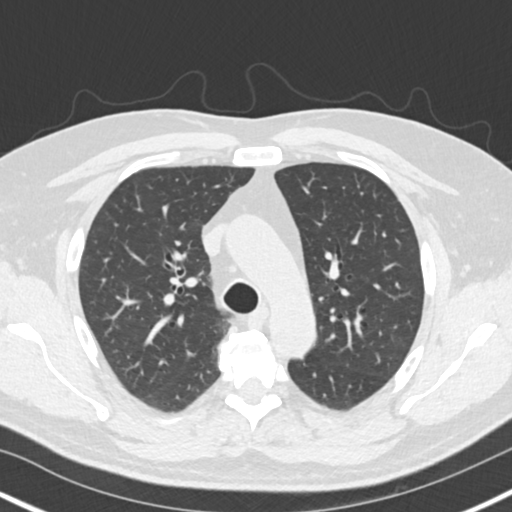
[im 124/161  lung]
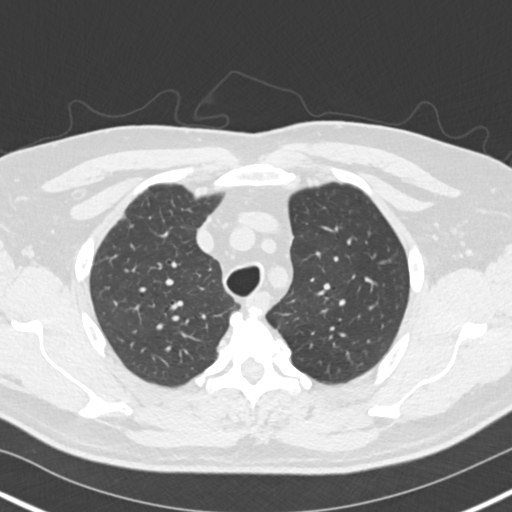
[im 136/161  lung]
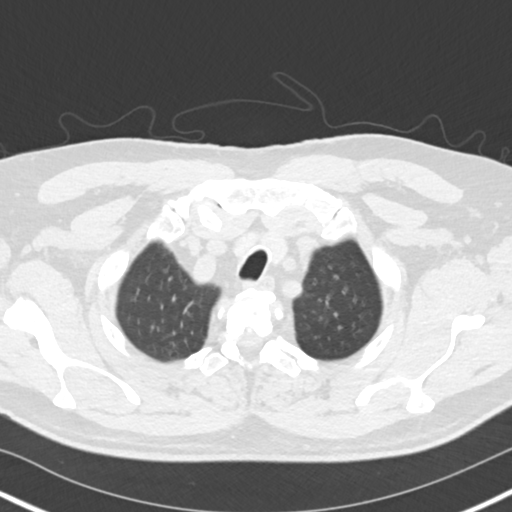
[im 148/161  lung]
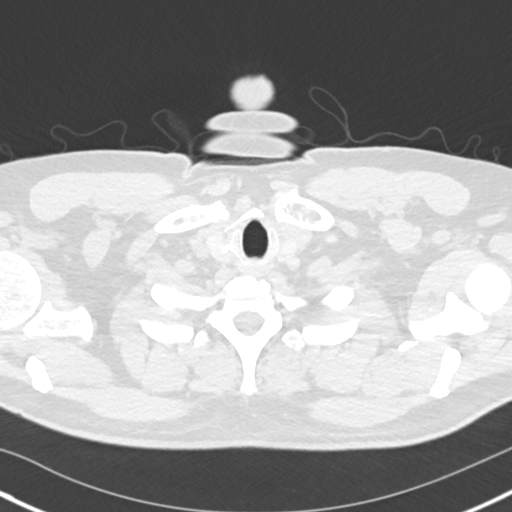

[Series 5: chest · coronal · 0.63mm/px · 3 of 162 slices shown (2 of 2)]
[im 33/162  lung]
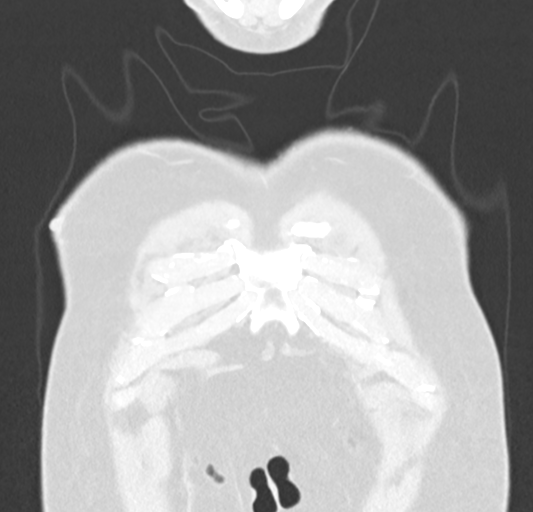
[im 65/162  lung]
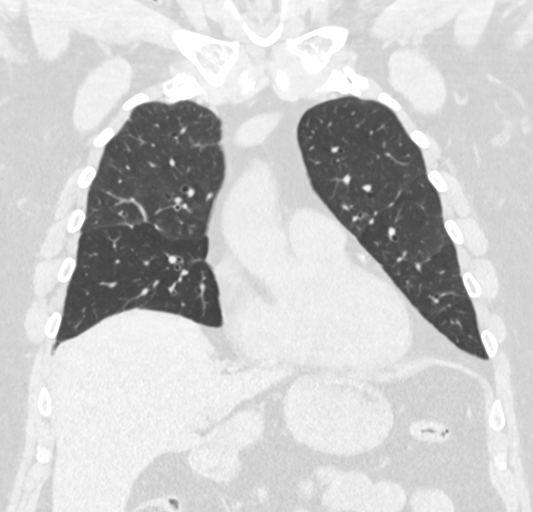
[im 97/162  lung]
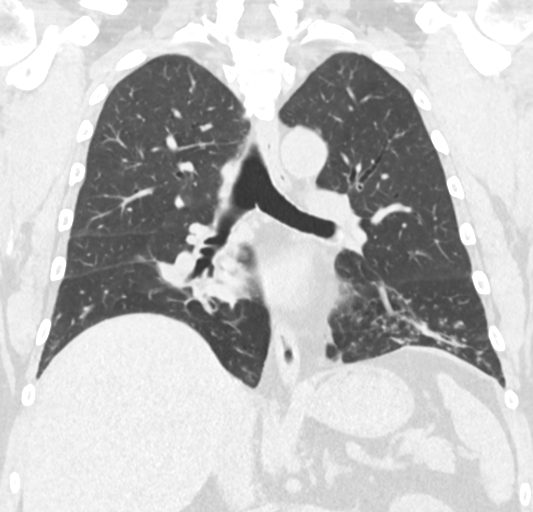

[15 of 36 positions shown; findings below may reference images not displayed]

FINDINGS: Cardiovascular: The heart size is normal. Aortic atherosclerosis.
Calcifications in the LAD coronary artery noted.

Mediastinum/Nodes: 8 mm low-density nodule identified in right lobe
of thyroid gland. The trachea appears patent and is midline. Normal
appearance of the esophagus. No mediastinal or hilar adenopathy.

Lungs/Pleura: No pleural effusion. Followup bilateral areas of
bronchiectasis are identified. Multiple progressive areas of
subsegmental atelectasis, ground-glass attenuation and thickening of
the peribronchovascular interstitium is identified. Scattered small
tree-in-bud nodularity is identified within the lower lobes
bilaterally and lingula. The previously noted solid nodule within
the posterior right lower lobe has resolved in the interval
compatible with an inflammatory or infectious process.

Upper Abdomen: No acute abnormality identified within the abdomen.

Musculoskeletal: Spondylosis identified within the thoracic spine.
No suspicious bone lesions identified.
IMPRESSION: 1. Previously noted posterior right lower lobe lung nodule has
resolved in the interval.
2. Significant interval progression postinflammatory/infectious
changes within both lower lobes and lingula with diffuse
bronchiectasis. Findings are favored to represent sequelae of
chronic atypical indolent infection such as ARMANNI. Careful clinical
correlation is advised.
3.  Aortic Atherosclerosis ([id number]).
4. LAD coronary artery atherosclerotic calcifications.

## 2018-10-25 ENCOUNTER — Inpatient Hospital Stay: Payer: Medicare Other | Attending: Oncology

## 2018-10-25 DIAGNOSIS — C7A8 Other malignant neuroendocrine tumors: Secondary | ICD-10-CM

## 2018-10-25 DIAGNOSIS — J479 Bronchiectasis, uncomplicated: Secondary | ICD-10-CM

## 2018-10-25 DIAGNOSIS — D509 Iron deficiency anemia, unspecified: Secondary | ICD-10-CM

## 2018-10-25 LAB — CBC WITH DIFFERENTIAL/PLATELET
Abs Immature Granulocytes: 0.01 10*3/uL (ref 0.00–0.07)
Basophils Absolute: 0.1 10*3/uL (ref 0.0–0.1)
Basophils Relative: 1 %
EOS PCT: 2 %
Eosinophils Absolute: 0.1 10*3/uL (ref 0.0–0.5)
HCT: 46.6 % (ref 39.0–52.0)
HEMOGLOBIN: 15.5 g/dL (ref 13.0–17.0)
Immature Granulocytes: 0 %
Lymphocytes Relative: 27 %
Lymphs Abs: 1.9 10*3/uL (ref 0.7–4.0)
MCH: 30.2 pg (ref 26.0–34.0)
MCHC: 33.3 g/dL (ref 30.0–36.0)
MCV: 90.7 fL (ref 80.0–100.0)
Monocytes Absolute: 0.5 10*3/uL (ref 0.1–1.0)
Monocytes Relative: 7 %
Neutro Abs: 4.5 10*3/uL (ref 1.7–7.7)
Neutrophils Relative %: 63 %
Platelets: 241 10*3/uL (ref 150–400)
RBC: 5.14 MIL/uL (ref 4.22–5.81)
RDW: 13.1 % (ref 11.5–15.5)
WBC: 7.1 10*3/uL (ref 4.0–10.5)
nRBC: 0 % (ref 0.0–0.2)

## 2018-10-25 LAB — FERRITIN: FERRITIN: 24 ng/mL (ref 24–336)

## 2018-10-25 LAB — IRON AND TIBC
IRON: 100 ug/dL (ref 45–182)
Saturation Ratios: 25 % (ref 17.9–39.5)
TIBC: 400 ug/dL (ref 250–450)
UIBC: 300 ug/dL

## 2018-10-25 LAB — VITAMIN B12: VITAMIN B 12: 256 pg/mL (ref 180–914)

## 2018-10-26 DIAGNOSIS — C7A8 Other malignant neuroendocrine tumors: Secondary | ICD-10-CM | POA: Diagnosis not present

## 2018-10-26 LAB — CHROMOGRANIN A: Chromogranin A: 99.7 ng/mL (ref 0.0–101.8)

## 2018-10-27 ENCOUNTER — Inpatient Hospital Stay: Payer: Medicare Other | Admitting: Oncology

## 2018-10-27 ENCOUNTER — Inpatient Hospital Stay: Payer: Medicare Other

## 2018-10-27 ENCOUNTER — Encounter: Payer: Self-pay | Admitting: Oncology

## 2018-10-27 ENCOUNTER — Other Ambulatory Visit: Payer: Self-pay

## 2018-10-27 VITALS — BP 157/79 | HR 96 | Temp 98.5°F | Resp 16 | Ht 67.0 in | Wt 202.4 lb

## 2018-10-27 DIAGNOSIS — D509 Iron deficiency anemia, unspecified: Secondary | ICD-10-CM

## 2018-10-27 DIAGNOSIS — J479 Bronchiectasis, uncomplicated: Secondary | ICD-10-CM

## 2018-10-27 DIAGNOSIS — C7A8 Other malignant neuroendocrine tumors: Secondary | ICD-10-CM

## 2018-10-27 DIAGNOSIS — K529 Noninfective gastroenteritis and colitis, unspecified: Secondary | ICD-10-CM

## 2018-10-27 MED ORDER — VITAMIN B-12 1000 MCG PO TABS: 1000.0000 ug | ORAL_TABLET | Freq: Every day | ORAL | 0 refills | Status: AC

## 2018-10-27 NOTE — Progress Notes (Signed)
Patient here today for follow up.  Patient states no new concerns today  

## 2018-10-27 NOTE — Progress Notes (Signed)
Hematology/Oncology Follow up note [facility] [phone] [phone]   Patient Care Team: Ronnie Curry, MD as PCP - General (Family Medicine) Ronnie Reyes, Ronnie Gleason, MD (General Surgery)  REFERRING PROVIDER: Gayland Curry, MD REASON FOR VISIT Follow up for management of neuroendocrine carcinoma, iron deficiency anemia and lung nodule.   HISTORY OF PRESENTING ILLNESS:  Ronnie Reyes is a  68 y.o.  male with PMH listed below who was referred to me for evaluation of anemia. Patient follows up with primary care physician and has labs done at her PCPs office. I reviewed patient's lab work from [place] which showed on 10/15/2017, patient had a hemoglobin of 11.7, this was significantly reduced from the lab value of hemoglobin 16 on 03/03/2017.  # Iron deficiency anemia s/p IV iron.  # Referred to GI for work up. 11/19/2017 EGD showed - Normal examined duodenum.- Normal esophagus.- A few gastric polyps. Biopsied.- A single gastric polyp. Biopsied. 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 Large inflamed polyp revealed well differentiated neuroendocrine tumor, 53m, +LVI, tumor extends to the deep biopsy base. pT1pNx  Histologic Type and Grade: G1, Well-differentiated neuroendocrine tumor  Mitotic Rate: 1 mitoses / 2 mm2  Ki-67 Labeling Index: <1 %  # Colonoscopy showed colon polyps with were snared and pathology revealed tubular adenoma negative for high grade dysplasia and malignancy.   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   # 06/02/2018 and repeat upper endoscopy on 05/20/2018. Capsule study showed area of mucosal inflammation and a possible ulceration in few locations.  Possible AVM with some bleeding.  Capsule did not exit the small bowel despite using  13-hour study.  It did exit the stomach within 90 minutes.  Prolonged small bowel transit.  [facility] seen in the setting of iron deficient anemia and NET type I of stomach.  May require balloon enteroscopy to evaluate further.  Patient reports that he has not established care yet.   INTERVAL HISTORY Ronnie SANTOLIis a 68y.o. male who has above history reviewed by me today present for follow-up visit for management of low-grade neuroendocrine tumor, iron deficiency anemia. Patient has previously received IV iron treatments.  Reports fatigue has improved.  Denies shortness of breath, chest pain, abdominal pain or change of bowel movements.  Denies any skin rash.  During interval, patient has had balloon enteroscopy by Dr. WConcepcion Livingfor further evaluation. Colonoscopy impression showed entire examined colon is normal.  Ileitis.  Status post biopsy.  Pathology showed small bowel mucosa with ulceration and reactive changes, with associated tattoo sites. Negative for lymphoma or other malignancy.     Review of Systems  Constitutional: Negative for chills, fever, malaise/fatigue and weight loss.  HENT: Negative for sore throat.   Eyes: Negative for redness.  Respiratory: Negative for cough, shortness of breath and wheezing.   Cardiovascular: Negative for chest pain, palpitations and leg swelling.  Gastrointestinal: Negative for abdominal pain, blood in stool, nausea and vomiting.  Genitourinary: Negative for dysuria.  Musculoskeletal: Negative for myalgias.  Skin: Negative for rash.  Neurological: Negative for dizziness, tingling and tremors.  Endo/Heme/Allergies: Does not bruise/bleed easily.  Psychiatric/Behavioral: Negative for hallucinations.    MEDICAL HISTORY:  Past Medical History:  Diagnosis Date  . Anemia   . Asthma   . Cancer [place]   skin cancer/arm and back  . Complication of anesthesia    HARD  TIME WAKING UP AFTER  APPENDECTOMY  . GERD (gastroesophageal reflux disease)   . Hernia   . Hypertension 2005   since 2005    SURGICAL HISTORY: Past Surgical History:  Procedure Laterality Date  . ANAL FISTULOTOMY N/A 01/02/2016   Procedure: ANAL FISTULOTOMY;  Surgeon: Ronnie Bellow, MD;  Location: [facility] ORS;  Service: General;  Laterality: N/A;  . APPENDECTOMY  2000  . CHOLECYSTECTOMY  2006  . COLONOSCOPY  [id number]   small serrated adenoma removed from the rectum at 15cm. This was his first screening colonoscopy  . COLONOSCOPY WITH PROPOFOL N/A 01/02/2016   Procedure: COLONOSCOPY WITH PROPOFOL;  Surgeon: Ronnie Bellow, MD;  Location: [facility] ENDOSCOPY;  Service: Endoscopy;  Laterality: N/A;  . COLONOSCOPY WITH PROPOFOL N/A 11/19/2017   Procedure: COLONOSCOPY WITH PROPOFOL;  Surgeon: Ronnie Bellows, MD;  Location: [facility] ENDOSCOPY;  Service: Gastroenterology;  Laterality: N/A;  . ESOPHAGOGASTRODUODENOSCOPY (EGD) WITH PROPOFOL N/A 11/19/2017   Procedure: ESOPHAGOGASTRODUODENOSCOPY (EGD) WITH PROPOFOL;  Surgeon: Ronnie Bellows, MD;  Location: [facility] ENDOSCOPY;  Service: Gastroenterology;  Laterality: N/A;  . ESOPHAGOGASTRODUODENOSCOPY (EGD) WITH PROPOFOL N/A 05/20/2018   Procedure: ESOPHAGOGASTRODUODENOSCOPY (EGD) WITH PROPOFOL;  Surgeon: Ronnie Bellows, MD;  Location: [facility] ENDOSCOPY;  Service: Gastroenterology;  Laterality: N/A;  . GIVENS CAPSULE STUDY N/A 05/20/2018   Procedure: GIVENS CAPSULE STUDY x 12 HR;  Surgeon: Ronnie Bellows, MD;  Location: [facility] ENDOSCOPY;  Service: Gastroenterology;  Laterality: N/A;  . ingrown toenail removal  1964  . NOSE SURGERY  1974    SOCIAL HISTORY: Social History   Socioeconomic History  . Marital status: Married    Spouse name: Not on file  . Number of children: Not on file  . Years of education: Not on file  . Highest education level: Not on file  Occupational History  . Not on file  Social Needs  . Financial resource strain: Not on file  . Food insecurity:     Worry: Not on file    Inability: Not on file  . Transportation needs:    Medical: Not on file    Non-medical: Not on file  Tobacco Use  . Smoking status: Never Smoker  . Smokeless tobacco: Never Used  Substance and Sexual Activity  . Alcohol use: No    Alcohol/week: 0.0 standard drinks  . Drug use: No  . Sexual activity: Yes  Lifestyle  . Physical activity:    Days per week: Not on file    Minutes per session: Not on file  . Stress: Not on file  Relationships  . Social connections:    Talks on phone: Not on file    Gets together: Not on file    Attends religious service: Not on file    Active member of club or organization: Not on file    Attends meetings of clubs or organizations: Not on file    Relationship status: Not on file  . Intimate partner violence:    Fear of current or ex partner: Not on file    Emotionally abused: Not on file    Physically abused: Not on file    Forced sexual activity: Not on file  Other Topics Concern  . Not on file  Social History Narrative  . Not on file    FAMILY HISTORY: Family History  Problem Relation Age of Onset  . Cancer Father        kidney  ALLERGIES:  is allergic to ace inhibitors; chlorthalidone; and pravastatin.  MEDICATIONS:  Current Outpatient Medications  Medication Sig Dispense Refill  . acetaminophen (TYLENOL) 500 MG tablet Take 1,250 mg by mouth.    Marland Kitchen albuterol (ACCUNEB) 0.63 MG/3ML nebulizer solution Take 1 ampule by nebulization every 6 (six) hours as needed for wheezing.    Marland Kitchen amLODipine (NORVASC) 10 MG tablet Take 10 mg by mouth every morning.   0  . atorvastatin (LIPITOR) 20 MG tablet Take by mouth.    . fexofenadine (ALLEGRA) 180 MG tablet Take 180 mg by mouth daily.    . fluticasone (FLONASE) 50 MCG/ACT nasal spray Place 1 spray into both nostrils daily.   3  . fluticasone furoate-vilanterol (BREO ELLIPTA) 100-25 MCG/INH AEPB Inhale 1 puff into the lungs daily.    . Glucos-Chondroit-Hyaluron-MSM  (GLUCOSAMINE CHONDROITIN JOINT) TABS Take by mouth.    Marland Kitchen ipratropium (ATROVENT) 0.02 % nebulizer solution Inhale into the lungs.    . Loratadine 10 MG CAPS Take 10 mg by mouth.    . Multiple Vitamins-Minerals (CENTRUM SILVER PO) Take by mouth once.    . niacin 500 MG tablet Take 500 mg by mouth at bedtime.    . Omega-3 Fatty Acids (FISH OIL) 1000 MG CAPS Take by mouth once.    Marland Kitchen omeprazole (PRILOSEC) 40 MG capsule Take 1 capsule (40 mg total) by mouth daily. 90 capsule 3  . vitamin B-12 (CYANOCOBALAMIN) 1000 MCG tablet Take 1 tablet (1,000 mcg total) by mouth daily. 90 tablet 0   No current facility-administered medications for this visit.      PHYSICAL EXAMINATION: ECOG PERFORMANCE STATUS: 1 - Symptomatic but completely ambulatory Vitals:   10/27/18 1322  BP: (!) 157/79  Pulse: 96  Resp: 16  Temp: 98.5 F (36.9 C)   Filed Weights   10/27/18 1322  Weight: 202 lb 7 oz (91.8 kg)    Physical Exam  Constitutional: He is oriented to person, place, and time. No distress.  HENT:  Head: Normocephalic and atraumatic.  Nose: Nose normal.  Mouth/Throat: Oropharynx is clear and moist. No oropharyngeal exudate.  Eyes: Pupils are equal, round, and reactive to light. Conjunctivae and EOM are normal. Left eye exhibits no discharge. No scleral icterus.  Neck: Normal range of motion. Neck supple. No JVD present.  Cardiovascular: Normal rate, regular rhythm and normal heart sounds. Exam reveals no friction rub.  No murmur heard. Pulmonary/Chest: Effort normal. No respiratory distress. He has wheezes.  Abdominal: Soft. Bowel sounds are normal. He exhibits no distension and no mass. There is no abdominal tenderness. There is no rebound and no guarding.  Musculoskeletal: Normal range of motion.        General: No tenderness or edema.  Lymphadenopathy:    He has no cervical adenopathy.  Neurological: He is alert and oriented to person, place, and time. No cranial nerve deficit. He exhibits  normal muscle tone. Coordination normal.  Skin: Skin is warm and dry. No rash noted. He is not diaphoretic. No erythema.  Psychiatric: Affect and judgment normal.     LABORATORY DATA:  I have reviewed the data as listed CBC Latest Ref Rng & Units 10/25/2018 07/26/2018 04/21/2018  WBC 4.0 - 10.5 K/uL 7.1 8.2 7.8  Hemoglobin 13.0 - 17.0 g/dL 15.5 16.2 14.9  Hematocrit 39.0 - 52.0 % 46.6 46.2 44.4  Platelets 150 - 400 K/uL 241 220 261   Reviewed patient's chemistry lab work that was done on 10/15/2017 at [place] [facility] system Normal kidney  and liver function.  RADIOGRAPHIC STUDIES: I have personally reviewed the radiological images as listed and agreed with the findings in the report. 04/30/2018 CT chest without showed previously noted posterior right lower lobe lung nodule has resolved during interval.  Significant interval progression post inflammatory/infectious changes within both lower lobes and lingula with diffuse bronchiectasis.  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 ASSESSMENT & PLAN:  1. Iron deficiency anemia, unspecified iron deficiency anemia type   2. Bronchiectasis without complication ([place])   3. Neuroendocrine carcinoma ([place])   4. Ileitis    # Iron deficiency anemia:  Reviewed patient iron panel and CBC test.  Hemoglobin remained stable, hemoglobin at 15.5, slightly lower than 3 months ago.  Iron panel consistent with functional iron deficiency.  Ferritin has slightly decreased compared to 3 months ago.  Still acceptable. Hold additional IV iron at this point.  Advised patient to continue oral iron supplementation.  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  #Ileitis, discussed with Dr. Vicente Males regarding further management plan. Dr.Anna will review his chart and [facility] findings.   #Neuroendocrine carcinoma, check chromogranin A and 5-HIAA every 3 months. #Chronic wheezing,/bilateral lower lobe bronchiectasis Previously  referred to pulmonology.  He has not established care yet.  Uses inhaler as needed. #History of B12 deficiency, B12 level at 256.  Recommend patient to take vitamin B 12 oral supplements 1000 MCG daily. We spent sufficient time to discuss many aspect of care, questions were answered to patient's satisfaction.  The patient knows to call the clinic with any problems questions or concerns.  Return of visit: 3 months. With repeat cbc, cmp, 5 HIAA, Chromogranin A, vitamin B12  Earlie Server, MD, PhD Hematology [place] at [facility] Pager- [phone] 10/27/2018

## 2018-11-04 LAB — 5 HIAA, QUANTITATIVE, URINE, 24 HOUR
5-HIAA, Ur: 2.6 mg/L
5-HIAA,Quant.,24 Hr Urine: 6.5 mg/24 hr (ref 0.0–14.9)
Total Volume: 2500

## 2018-11-15 ENCOUNTER — Telehealth: Payer: Self-pay | Admitting: Gastroenterology

## 2018-11-15 NOTE — Telephone Encounter (Signed)
Patient's spouse called in today stating they have been to [facility] and have their testing now they would like to know what the next step is.

## 2018-11-16 NOTE — Telephone Encounter (Signed)
Pt wife is calling about prev. Message to find out what the next step is

## 2018-11-17 NOTE — Telephone Encounter (Signed)
Spoke with pt spouse, Hoyle Sauer, regarding her question about pt next steps for his GI issues. Pt wife states that during pt 10-27-2018 office visit with Dr. Tasia Catchings, they were told Dr. Tasia Catchings would be discussing pt condition and reviewing [facility] notes with Dr. Vicente Males to determine next steps. Pt wife states no one has contacted the pt with a plan. Pt is concerned and wants to know what to do. I explained that I will relay this to Dr. Vicente Males and then advise.

## 2018-11-17 NOTE — Telephone Encounter (Signed)
PT IS CALLING ABOUT PREV. MESSAGE PLEASE ADVISE

## 2018-11-18 ENCOUNTER — Telehealth: Payer: Self-pay | Admitting: Oncology

## 2018-11-18 NOTE — Telephone Encounter (Signed)
Discussed with [place] gastroenterology Dr. Vicente Males about patient's case.  Dr. Vicente Males recommend patient to make an follow-up visit to see Dr. Malissa Hippo at [facility] there is a telephone note in care everywhere patient was offered appointment and he did not want to go.  I called patient and spoke with patient's wife.  Wife informed me that patient now has an appointment with Dr. Hulen Skains on November 23, 2018 to discuss further plan.

## 2018-11-19 NOTE — Telephone Encounter (Signed)
I have discussed his case with Dr Tasia Catchings, She will discuss with the patient. I have not seen or been in directly involved with his care for a while. He needs to be seen by the GI at [facility] as he did the last procedures to help give [place] an idea as to how to proceed.   Dr Jonathon Bellows MD,MRCP [facility]) Gastroenterology/Hepatology Pager: [phone]

## 2018-11-26 ENCOUNTER — Telehealth: Payer: Self-pay | Admitting: Urology

## 2018-11-26 NOTE — Telephone Encounter (Signed)
Patient's daughter called and stated that the patient had a CT scan done at [facility] and the doctor there was concerned about her kidney function and wanted you to take a look at it and advise.  Dr. Cristi Loron ordered it [phone]  Thanks, Sharyn Lull

## 2018-11-26 NOTE — Telephone Encounter (Signed)
I cannot actually see the images but based on the report, it seems unchanged from what we discussed in the office a few months ago.    Reviewing his lab work, his overall renal function as fine.    If he would like me to review the images, I would be happy to do so but he will need to bring a disk by the office.    Hollice Espy, MD

## 2018-11-30 NOTE — Telephone Encounter (Signed)
Called and spoke with patients wife, Hoyle Sauer, regarding Dr Audree Bane response to CT report. Patients wife verbalized understanding and appreciation as Dr Malissa Hippo suggested they reach out and have CT reviewed.  She said she doesn't see the need to bring a disk for image review if Dr Erlene Quan thinks everything is ok from report and lab work.

## 2018-12-01 ENCOUNTER — Encounter: Payer: Self-pay | Admitting: Gastroenterology

## 2019-01-24 ENCOUNTER — Other Ambulatory Visit: Payer: Medicare Other

## 2019-01-26 ENCOUNTER — Ambulatory Visit: Payer: Medicare Other

## 2019-01-26 ENCOUNTER — Ambulatory Visit: Payer: Medicare Other | Admitting: Oncology

## 2019-07-11 DIAGNOSIS — I10 Essential (primary) hypertension: Secondary | ICD-10-CM | POA: Insufficient documentation

## 2019-07-11 HISTORY — DX: Essential (primary) hypertension: I10

## 2019-08-11 ENCOUNTER — Encounter: Payer: Self-pay | Admitting: Emergency Medicine

## 2019-08-11 ENCOUNTER — Ambulatory Visit
Admission: EM | Admit: 2019-08-11 | Discharge: 2019-08-11 | Disposition: A | Discharge status: Home / Self Care | Payer: Medicare Other | Attending: Family Medicine | Admitting: Family Medicine

## 2019-08-11 ENCOUNTER — Other Ambulatory Visit: Payer: Self-pay

## 2019-08-11 DIAGNOSIS — X500XXA Overexertion from strenuous movement or load, initial encounter: Secondary | ICD-10-CM

## 2019-08-11 DIAGNOSIS — M5442 Lumbago with sciatica, left side: Secondary | ICD-10-CM | POA: Diagnosis not present

## 2019-08-11 MED ORDER — PREDNISONE 10 MG (21) PO TBPK: ORAL_TABLET | ORAL | 0 refills | Status: DC

## 2019-08-11 MED ORDER — BACLOFEN 10 MG PO TABS: 10.0000 mg | ORAL_TABLET | Freq: Three times a day (TID) | ORAL | 0 refills | Status: DC | PRN

## 2019-08-11 MED ORDER — HYDROCODONE-ACETAMINOPHEN 5-325 MG PO TABS: 1.0000 | ORAL_TABLET | Freq: Three times a day (TID) | ORAL | 0 refills | Status: DC | PRN

## 2019-08-11 MED ORDER — KETOROLAC TROMETHAMINE 60 MG/2ML IM SOLN
30.0000 mg | Freq: Once | INTRAMUSCULAR | Status: AC
  Administered 2019-08-11: 14:00:00 30 mg via INTRAMUSCULAR

## 2019-08-11 NOTE — ED Provider Notes (Signed)
[facility]    CSN: [id number] Arrival date & time: 08/11/19  1350  History   Chief Complaint Chief Complaint  Patient presents with  . Back Pain   HPI  68 year old male presents with acute low back pain.  Patient reports that he was washing the car and was sitting on a stool.  He twisted abruptly and developed sudden onset excruciating left-sided low back pain with radiation down his left leg.  No fall or other injury.  Pain is severe.  Rates his pain as 10/10 in severity.  No reports of saddle anesthesia or incontinence.  He has taken some leftover oxycodone without relief.  Patient has difficulty ambulating.  Worse with activity.  No other associated symptoms.  No other complaints.  PMH, Surgical Hx, Family Hx, Social History reviewed and updated as below.  Past Medical History:  Diagnosis Date  . Anemia   . Asthma   . Cancer ([place]) 2003   skin cancer/arm and back  . Complication of anesthesia    HARD TIME WAKING UP AFTER  APPENDECTOMY  . GERD (gastroesophageal reflux disease)   . Hernia   . Hypertension 2005   since 2005   Patient Active Problem List   Diagnosis Date Noted  . 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 Allergic state 11/02/2017  . Hyperlipidemia, unspecified 11/02/2017  . Moderate persistent asthma without complication [id number]  . Iron deficiency anemia 11/02/2017  . 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 Abdominal pain 05/18/2017  . Cancer ([place])   . Anemia 08/04/2012   Past Surgical History:  Procedure Laterality Date  . ANAL FISTULOTOMY N/A 01/02/2016   Procedure: ANAL FISTULOTOMY;  Surgeon: Robert Bellow, MD;  Location: [facility] ORS;  Service: General;  Laterality: N/A;  . APPENDECTOMY  2000  . CHOLECYSTECTOMY  2006  . COLONOSCOPY  [id number]   small serrated adenoma removed from the rectum at 15cm. This was his first screening colonoscopy  . COLONOSCOPY WITH PROPOFOL N/A 01/02/2016   Procedure: COLONOSCOPY WITH PROPOFOL;   Surgeon: Robert Bellow, MD;  Location[facility] ENDOSCOPY;  Service: Endoscopy;  Laterality: N/A;  . COLONOSCOPY WITH PROPOFOL N/A 11/19/2017   Procedure: COLONOSCOPY WITH PROPOFOL;  Surgeon: Jonathon Bellows, MD;  Location: [facility] ENDOSCOPY;  Service: Gastroenterology;  Laterality: N/A;  . ESOPHAGOGASTRODUODENOSCOPY (EGD) WITH PROPOFOL N/A 11/19/2017   Procedure: ESOPHAGOGASTRODUODENOSCOPY (EGD) WITH PROPOFOL;  Surgeon: Jonathon Bellows, MD;  Location: [facility] ENDOSCOPY;  Service: Gastroenterology;  Laterality: N/A;  . ESOPHAGOGASTRODUODENOSCOPY (EGD) WITH PROPOFOL N/A 05/20/2018   Procedure: ESOPHAGOGASTRODUODENOSCOPY (EGD) WITH PROPOFOL;  Surgeon: Jonathon Bellows, MD;  Location: [facility] ENDOSCOPY;  Service: Gastroenterology;  Laterality: N/A;  . GIVENS CAPSULE STUDY N/A 05/20/2018   Procedure: GIVENS CAPSULE STUDY x 12 HR;  Surgeon: Jonathon Bellows, MD;  Location: [facility] ENDOSCOPY;  Service: Gastroenterology;  Laterality: N/A;  . ingrown toenail removal  1964  . NOSE SURGERY  1974   Home Medications    Prior to Admission medications   Medication Sig Start Date End Date Taking? Authorizing Provider  acetaminophen (TYLENOL) 500 MG tablet Take 1,250 mg by mouth.   Yes [provider]  albuterol (ACCUNEB) 0.63 MG/3ML nebulizer solution Take 1 ampule by nebulization every 6 (six) hours as needed for wheezing.   Yes [provider]  amLODipine (NORVASC) 10 MG tablet Take 10 mg by mouth every morning.  08/21/15  Yes [provider]  atorvastatin (LIPITOR) 20 MG tablet Take by mouth. 10/23/17 08/11/19 Yes [provider]  fluticasone Asencion Islam)  50 MCG/ACT nasal spray Place 1 spray into both nostrils daily.  08/17/15  Yes [provider]  fluticasone furoate-vilanterol (BREO ELLIPTA) 100-25 MCG/INH AEPB Inhale 1 puff into the lungs daily.   Yes [provider]  Glucos-Chondroit-Hyaluron-MSM (GLUCOSAMINE CHONDROITIN JOINT) TABS Take by mouth.   Yes [provider]   ipratropium (ATROVENT) 0.02 % nebulizer solution Inhale into the lungs. 09/21/17 08/11/19 Yes [provider]  Multiple Vitamins-Minerals (CENTRUM SILVER PO) Take by mouth once.   Yes [provider]  niacin 500 MG tablet Take 500 mg by mouth at bedtime.   Yes [provider]  Omega-3 Fatty Acids (FISH OIL) 1000 MG CAPS Take by mouth once.   Yes [provider]  vitamin B-12 (CYANOCOBALAMIN) 1000 MCG tablet Take 1 tablet (1,000 mcg total) by mouth daily. 10/27/18  Yes Earlie Server, MD  baclofen (LIORESAL) 10 MG tablet Take 1 tablet (10 mg total) by mouth 3 (three) times daily as needed for muscle spasms. 08/11/19   Coral Spikes, DO  HYDROcodone-acetaminophen (NORCO/VICODIN) 5-325 MG tablet Take 1 tablet by mouth every 8 (eight) hours as needed for severe pain. 08/11/19   Coral Spikes, DO  predniSONE (STERAPRED UNI-PAK 21 TAB) 10 MG (21) TBPK tablet 6 tablets on day 1; decrease by 1 tablet daily until gone. 08/11/19   Coral Spikes, DO  fexofenadine (ALLEGRA) 180 MG tablet Take 180 mg by mouth daily.  08/11/19  [provider]  Loratadine 10 MG CAPS Take 10 mg by mouth.  08/11/19  [provider]  omeprazole (PRILOSEC) 40 MG capsule Take 1 capsule (40 mg total) by mouth daily. 11/11/17 08/11/19  Jonathon Bellows, MD    Family History Family History  Problem Relation Age of Onset  . Cancer Father        kidney    Social History Social History   Tobacco Use  . Smoking status: Never Smoker  . Smokeless tobacco: Never Used  Substance Use Topics  . Alcohol use: No    Alcohol/week: 0.0 standard drinks  . Drug use: No     Allergies   Ace inhibitors, Chlorthalidone, and Pravastatin   Review of Systems Review of Systems  Constitutional: Negative.   Musculoskeletal: Positive for back pain.   Physical Exam Triage Vital Signs ED Triage Vitals  Enc Vitals Group     BP 08/11/19 1409 138/80     Pulse Rate 08/11/19 1409 84     Resp 08/11/19  1409 18     Temp 08/11/19 1409 98.4 F (36.9 C)     Temp Source 08/11/19 1409 Oral     SpO2 08/11/19 1409 97 %     Weight 08/11/19 1404 195 lb (88.5 kg)     Height --      Head Circumference --      Peak Flow --      Pain Score 08/11/19 1403 10     Pain Loc --      Pain Edu? --      Excl. in [place]? --    Updated Vital Signs BP 138/80 (BP Location: Left Arm)   Pulse 84   Temp 98.4 F (36.9 C) (Oral)   Resp 18   Wt 88.5 kg   SpO2 97%   BMI 30.54 kg/m   Visual Acuity Right Eye Distance:   Left Eye Distance:   Bilateral Distance:    Right Eye Near:   Left Eye Near:    Bilateral Near:  Physical Exam Vitals signs and nursing note reviewed.  Constitutional:      General: He is not in acute distress.    Comments: Appears in pain.  HENT:     Head: Normocephalic and atraumatic.  Eyes:     General:        Right eye: No discharge.        Left eye: No discharge.     Conjunctiva/sclera: Conjunctivae normal.  Cardiovascular:     Rate and Rhythm: Normal rate and regular rhythm.  Pulmonary:     Effort: Pulmonary effort is normal.     Breath sounds: Normal breath sounds. No wheezing, rhonchi or rales.  Musculoskeletal:     Comments: Left low back with exquisite left-sided tenderness to palpation and spasm.  Neurological:     Mental Status: He is alert.  Psychiatric:        Mood and Affect: Mood normal.        Behavior: Behavior normal.    UC Treatments / Results  Labs (all labs ordered are listed, but only abnormal results are displayed) Labs Reviewed - No data to display  EKG   Radiology No results found.  Procedures Procedures (including critical care time)  Medications Ordered in UC Medications  ketorolac (TORADOL) injection 30 mg (30 mg Intramuscular Given 08/11/19 1422)    Initial Impression / Assessment and Plan / UC Course  I have reviewed the triage vital signs and the nursing notes.  Pertinent labs & imaging results that were available during  my care of the patient were reviewed by me and considered in my medical decision making (see chart for details).    68 year old male presents with acute severe low back pain.  Prior x-rays reviewed.  Patient has degenerative changes.  No indications for imaging at this time.  Toradol given in the clinic.  Sending home on baclofen, prednisone taper, and hydrocodone for severe pain.  [place] controlled substance database reviewed.  No concerns for abuse.  Final Clinical Impressions(s) / UC Diagnoses   Final diagnoses:  Acute left-sided low back pain with left-sided sciatica     Discharge Instructions     Rest.   Ice and Heat.  Medication as prescribed.  Take care  Dr. Lacinda Axon    ED Prescriptions    Medication Sig Dispense Auth. Provider   baclofen (LIORESAL) 10 MG tablet Take 1 tablet (10 mg total) by mouth 3 (three) times daily as needed for muscle spasms. 30 each Darvell Monteforte G, DO   predniSONE (STERAPRED UNI-PAK 21 TAB) 10 MG (21) TBPK tablet 6 tablets on day 1; decrease by 1 tablet daily until gone. 21 tablet Elyas Villamor G, DO   HYDROcodone-acetaminophen (NORCO/VICODIN) 5-325 MG tablet Take 1 tablet by mouth every 8 (eight) hours as needed for severe pain. 10 tablet Thersa Salt G, DO     I have reviewed the PDMP during this encounter.   Coral Spikes, [place] 08/11/19 1514

## 2019-08-11 NOTE — ED Triage Notes (Signed)
Patient in office with wife c/o LBP happened today around 11:00 washing car sitting on stool and twisted when it happened Stated didn't fall was bending over  [id number] )

## 2019-08-11 NOTE — Discharge Instructions (Signed)
Rest.  Ice and Heat.  Medication as prescribed.  Take care  Dr. Marijean Montanye  

## 2019-09-09 ENCOUNTER — Other Ambulatory Visit: Payer: Self-pay

## 2019-09-09 ENCOUNTER — Ambulatory Visit
Admission: RE | Admit: 2019-09-09 | Discharge: 2019-09-09 | Disposition: A | Discharge status: Home / Self Care | Payer: Medicare Other | Source: Ambulatory Visit | Attending: Urology | Admitting: Urology

## 2019-09-09 DIAGNOSIS — N281 Cyst of kidney, acquired: Secondary | ICD-10-CM | POA: Diagnosis present

## 2019-09-09 DIAGNOSIS — N1330 Unspecified hydronephrosis: Secondary | ICD-10-CM

## 2019-09-13 ENCOUNTER — Other Ambulatory Visit: Payer: Self-pay

## 2019-09-13 ENCOUNTER — Encounter: Payer: Self-pay | Admitting: Urology

## 2019-09-13 ENCOUNTER — Ambulatory Visit: Payer: Medicare Other | Admitting: Urology

## 2019-09-13 VITALS — BP 165/71 | HR 92 | Ht 67.0 in | Wt 201.0 lb

## 2019-09-13 DIAGNOSIS — M5442 Lumbago with sciatica, left side: Secondary | ICD-10-CM | POA: Diagnosis not present

## 2019-09-13 DIAGNOSIS — N281 Cyst of kidney, acquired: Secondary | ICD-10-CM

## 2019-09-13 DIAGNOSIS — N1330 Unspecified hydronephrosis: Secondary | ICD-10-CM | POA: Diagnosis not present

## 2019-09-13 NOTE — Progress Notes (Signed)
09/13/2019 2:33 PM   [name] 03-15-1951 [id number]  Referring provider: Gayland Curry, MD [address] [place],  [place] [address]  Chief Complaint  Patient presents with  . Hydronephrosis    HPI: 68 year old male who returns today for routine annual follow-up.  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  Please see previous note for details.  Previously followed by Dr. Bernardo Heater at [facility] last seen in 05/2018 for the same issue.  He is a known history of a left glomera of parapelvic cyst measuring 7 x 9.3 cm.   A 2010 CT imaging showed a left parapelvic cyst (5 cm), re-demonstrated on a recent PET CT from 3/19. CT from 2010 also showed mild dilation of the left collecting system, secondary mass effect.  Upper tract parenchyma is normal.   This is now being followed serially with renal ultrasound and creatinine.  Most recent imaging shows a stable 9 cm left parapelvic cyst with mild hydronephrosis versus caliectasis of the upper pole with stable parenchyma.  Bilateral ureteral jets are appreciated.  Creatinine remains at baseline.  Most recent creatinine 10/2018 0.9.  He denies any overt flank pain.  He was seen in the emergency room with excruciating left low back pain.  This radiated down the back of his leg.  It was exacerbated with activity, improves with rest.  Slowly been improving.  No gross hematuria or UTIs.   PMH: Past Medical History:  Diagnosis Date  . Anemia   . Asthma   . Cancer ([place]) 2003   skin cancer/arm and back  . Complication of anesthesia    HARD TIME WAKING UP AFTER  APPENDECTOMY  . GERD (gastroesophageal reflux disease)   . Hernia   . Hypertension 2005   since 2005    Surgical History: Past Surgical History:  Procedure Laterality Date  . ANAL FISTULOTOMY N/A 01/02/2016   Procedure: ANAL FISTULOTOMY;  Surgeon: Robert Bellow, MD;  Location: [facility] ORS;  Service: General;   Laterality: N/A;  . APPENDECTOMY  2000  . CHOLECYSTECTOMY  2006  . COLONOSCOPY  [id number]   small serrated adenoma removed from the rectum at 15cm. This was his first screening colonoscopy  . COLONOSCOPY WITH PROPOFOL N/A 01/02/2016   Procedure: COLONOSCOPY WITH PROPOFOL;  Surgeon: Robert Bellow, MD;  Location: [facility] ENDOSCOPY;  Service: Endoscopy;  Laterality: N/A;  . COLONOSCOPY WITH PROPOFOL N/A 11/19/2017   Procedure: COLONOSCOPY WITH PROPOFOL;  Surgeon: Jonathon Bellows, MD;  Location: [facility] ENDOSCOPY;  Service: Gastroenterology;  Laterality: N/A;  . ESOPHAGOGASTRODUODENOSCOPY (EGD) WITH PROPOFOL N/A 11/19/2017   Procedure: ESOPHAGOGASTRODUODENOSCOPY (EGD) WITH PROPOFOL;  Surgeon: Jonathon Bellows, MD;  Location: [facility] ENDOSCOPY;  Service: Gastroenterology;  Laterality: N/A;  . ESOPHAGOGASTRODUODENOSCOPY (EGD) WITH PROPOFOL N/A 05/20/2018   Procedure: ESOPHAGOGASTRODUODENOSCOPY (EGD) WITH PROPOFOL;  Surgeon: Jonathon Bellows, MD;  Location: [facility] ENDOSCOPY;  Service: Gastroenterology;  Laterality: N/A;  . GIVENS CAPSULE STUDY N/A 05/20/2018   Procedure: GIVENS CAPSULE STUDY x 12 HR;  Surgeon: Jonathon Bellows, MD;  Location: [facility] ENDOSCOPY;  Service: Gastroenterology;  Laterality: N/A;  . ingrown toenail removal  1964  . NOSE SURGERY  1974    Home Medications:  Allergies as of 09/13/2019      Reactions   Ace Inhibitors Shortness Of Breath   Chlorthalidone Anaphylaxis   Pravastatin Rash   Can take lovastatin 20      Medication List       Accurate as of September 13, 2019 11:59 PM. If you have any questions, ask your nurse or doctor.        STOP taking these medications   ipratropium 0.02 % nebulizer solution Commonly known as: ATROVENT Stopped by: Hollice Espy, MD   predniSONE 10 MG (21) Tbpk tablet Commonly known as: STERAPRED UNI-PAK 21 TAB Stopped by: Hollice Espy, MD     TAKE these medications   acetaminophen 500 MG tablet Commonly known as: TYLENOL Take 1,250 mg by mouth.    albuterol 0.63 MG/3ML nebulizer solution Commonly known as: ACCUNEB Take 1 ampule by nebulization every 6 (six) hours as needed for wheezing.   amLODipine 10 MG tablet Commonly known as: NORVASC Take 10 mg by mouth every morning.   atorvastatin 20 MG tablet Commonly known as: LIPITOR Take by mouth.   baclofen 10 MG tablet Commonly known as: LIORESAL Take 1 tablet (10 mg total) by mouth 3 (three) times daily as needed for muscle spasms.   Breo Ellipta 100-25 MCG/INH Aepb Generic drug: fluticasone furoate-vilanterol Inhale 1 puff into the lungs daily.   CENTRUM SILVER PO Take by mouth once.   Fish Oil 1000 MG Caps Take by mouth once.   fluticasone 50 MCG/ACT nasal spray Commonly known as: FLONASE Place 1 spray into both nostrils daily.   Glucosamine Chondroitin Joint Tabs Take by mouth.   HYDROcodone-acetaminophen 5-325 MG tablet Commonly known as: NORCO/VICODIN Take 1 tablet by mouth every 8 (eight) hours as needed for severe pain.   niacin 500 MG tablet Take 500 mg by mouth at bedtime.   vitamin B-12 1000 MCG tablet Commonly known as: CYANOCOBALAMIN Take 1 tablet (1,000 mcg total) by mouth daily.       Allergies:  Allergies  Allergen Reactions  . Ace Inhibitors Shortness Of Breath  . Chlorthalidone Anaphylaxis  . Pravastatin Rash    Can take lovastatin 20    Family History: Family History  Problem Relation Age of Onset  . Cancer Father        kidney    Social History:  reports that he has never smoked. He has never used smokeless tobacco. He reports that he does not drink alcohol or use drugs.  ROS: UROLOGY Frequent Urination?: No Hard to postpone urination?: No Burning/pain with urination?: No Get up at night to urinate?: Yes Leakage of urine?: No Urine stream starts and stops?: No Trouble starting stream?: No Do you have to strain to urinate?: No Blood in urine?: No Urinary tract infection?: No Sexually transmitted disease?: No Injury  to kidneys or bladder?: No Painful intercourse?: No Weak stream?: No Erection problems?: No Penile pain?: No  Gastrointestinal Nausea?: No Vomiting?: No Indigestion/heartburn?: No Diarrhea?: No Constipation?: No  Constitutional Fever: No Night sweats?: No Weight loss?: No Fatigue?: No  Skin Skin rash/lesions?: No Itching?: No  Eyes Blurred vision?: No Double vision?: No  Ears/Nose/Throat Sore throat?: No Sinus problems?: No  Hematologic/Lymphatic Swollen glands?: No Easy bruising?: No  Cardiovascular Leg swelling?: No Chest pain?: No  Respiratory Cough?: No Shortness of breath?: No  Endocrine Excessive thirst?: No  Musculoskeletal Back pain?: Yes Joint pain?: No  Neurological Headaches?: No Dizziness?: No  Psychologic Depression?: No Anxiety?: No  Physical Exam: BP (!) 165/71   Pulse 92   Ht 5\' 7"  ([id number] m)   Wt 201 lb (91.2 kg)   BMI 31.48 kg/m   Constitutional:  Alert and oriented, No acute distress.  Accompanied by wife today. HEENT: [place] AT, moist mucus membranes.  Trachea midline, no  masses. Cardiovascular: No clubbing, cyanosis, or edema. Respiratory: Normal respiratory effort, no increased work of breathing. Skin: No rashes, bruises or suspicious lesions. Neurologic: Grossly intact, no focal deficits, moving all 4 extremities. Psychiatric: Normal mood and affect.  Laboratory Data: All 2020 labs from care everywhere reviewed.   Pertinent Imaging: Results for orders placed during the hospital encounter of 09/09/19  US RENAL   Narrative CLINICAL DATA:  Initial evaluation for left-sided hydronephrosis.  EXAM: RENAL / URINARY TRACT ULTRASOUND COMPLETE  COMPARISON:  Prior CT from 06/21/2009.  FINDINGS: Right Kidney:  Renal measurements: 11.3 x 5.1 x 5.6 cm = volume: 169.2 mL. Echogenicity within normal limits. No mass or hydronephrosis visualized.  Left Kidney:  Renal measurements: 14.2 x 6.9 x 5.5 cm = volume: 284.3 mL.  Echogenicity within normal limits. Large parapelvic cyst measuring 7.0 x 6.3 x 9.0 cm present at the mid-lower pole. No significant internal complexity. Mild hydronephrosis seen within the adjacent upper pole, which could be postobstructive in nature by the adjacent large parapelvic cyst.  Bladder:  Appears normal for degree of bladder distention. Both ureteral jets are visualized.  Other:  None.  IMPRESSION: 1. Large 9 cm parapelvic simple cyst at the mid-lower left kidney. 2. Mild hydronephrosis within the adjacent upper pole of the left kidney, which could be postobstructive in nature due to the adjacent cyst. Both ureteral jets are visualized at the bladder. 3. Otherwise unremarkable and normal renal ultrasound.   Electronically Signed   By: Jeannine Boga M.D.   On: 09/09/2019 23:23    Renal ultrasound images were personally reviewed.  Agree with radiologic interpretation.  Patient stable large left parapelvic cyst with some upper pole caliectasis, parenchyma appears grossly normal and stable.  Assessment & Plan:    1. Acquired renal cyst of left kidney Large left parapelvic cyst, asymptomatic, chronic  Most recent imaging shows stable left upper pole parenchyma with likely some mild obstructive changes (hydronephrosis) but overall preserved parenchyma and renal function  Would mostly recommend continued conservative management, will repeat renal ultrasound about 2 years unless his renal function surgically has other symptoms He is agreeable this plan  2. Hydronephrosis, unspecified hydronephrosis type As above  3. Acute left-sided low back pain with left-sided sciatica Associated with known trauma, improving  Nature pain is not consistent with this incidental stable cyst   Return in about 2 years (around 09/12/2021) for RUS.  Hollice Espy, MD  [facility] [address] [place], [place] [address] [phone]

## 2019-12-26 DIAGNOSIS — J449 Chronic obstructive pulmonary disease, unspecified: Secondary | ICD-10-CM | POA: Insufficient documentation

## 2019-12-26 HISTORY — DX: Chronic obstructive pulmonary disease, unspecified: J44.9

## 2020-03-14 ENCOUNTER — Other Ambulatory Visit: Payer: Self-pay

## 2020-03-14 ENCOUNTER — Ambulatory Visit
Admission: EM | Admit: 2020-03-14 | Discharge: 2020-03-14 | Disposition: A | Discharge status: Home / Self Care | Payer: Medicare Other | Attending: Family Medicine | Admitting: Family Medicine

## 2020-03-14 ENCOUNTER — Ambulatory Visit (INDEPENDENT_AMBULATORY_CARE_PROVIDER_SITE_OTHER): Payer: Medicare Other

## 2020-03-14 DIAGNOSIS — M5440 Lumbago with sciatica, unspecified side: Secondary | ICD-10-CM

## 2020-03-14 MED ORDER — BACLOFEN 10 MG PO TABS: 10.0000 mg | ORAL_TABLET | Freq: Three times a day (TID) | ORAL | 0 refills | Status: DC | PRN

## 2020-03-14 MED ORDER — PREDNISONE 10 MG PO TABS: ORAL_TABLET | ORAL | 0 refills | Status: DC

## 2020-03-14 MED ORDER — HYDROCODONE-ACETAMINOPHEN 5-325 MG PO TABS: 1.0000 | ORAL_TABLET | Freq: Three times a day (TID) | ORAL | 0 refills | Status: DC | PRN

## 2020-03-14 MED ORDER — KETOROLAC TROMETHAMINE 30 MG/ML IJ SOLN
30.0000 mg | Freq: Once | INTRAMUSCULAR | Status: AC
  Administered 2020-03-14: 30 mg via INTRAMUSCULAR

## 2020-03-14 NOTE — ED Triage Notes (Signed)
Patient has low back pain and radiates down the back of his leg. Patient states that this happened while using a stool to wash vehicles yesterday.

## 2020-03-14 NOTE — ED Provider Notes (Signed)
[facility]    CSN: [id number] Arrival date & time: 03/14/20  1515      History   Chief Complaint Chief Complaint  Patient presents with  . Back Pain   HPI   69 year old male presents with the above complaint.  Patient has known degenerative disc disease/osteoarthritis.  Patient reports that he was sitting on a stool washing vehicles yesterday.  He has done this previously and it resulted in back pain.  He states that he developed severe low back pain with radiation down his leg.  Pain is 8/10 in severity.  Worse with activity/range of motion.  No relieving factors.  He has taken some leftover medication at home without relief.  No other associated symptoms.  No other complaints.  Past Medical History:  Diagnosis Date  . Anemia   . Asthma   . Cancer ([place]) 2003   skin cancer/arm and back  . Complication of anesthesia    HARD TIME WAKING UP AFTER  APPENDECTOMY  . GERD (gastroesophageal reflux disease)   . Hernia   . Hypertension 2005   since 2005    Patient Active Problem List   Diagnosis Date Noted  . 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 Allergic state 11/02/2017  . Hyperlipidemia, unspecified 11/02/2017  . Moderate persistent asthma without complication [id number]  . Iron deficiency anemia 11/02/2017  . 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 Abdominal pain 05/18/2017  . Cancer ([place])   . Anemia 08/04/2012    Past Surgical History:  Procedure Laterality Date  . ANAL FISTULOTOMY N/A 01/02/2016   Procedure: ANAL FISTULOTOMY;  Surgeon: Robert Bellow, MD;  Location: [facility] ORS;  Service: General;  Laterality: N/A;  . APPENDECTOMY  2000  . CHOLECYSTECTOMY  2006  . COLONOSCOPY  [id number]   small serrated adenoma removed from the rectum at 15cm. This was his first screening colonoscopy  . COLONOSCOPY WITH PROPOFOL N/A 01/02/2016   Procedure: COLONOSCOPY WITH PROPOFOL;  Surgeon: Robert Bellow, MD;  Location: [facility] ENDOSCOPY;  Service: Endoscopy;   Laterality: N/A;  . COLONOSCOPY WITH PROPOFOL N/A 11/19/2017   Procedure: COLONOSCOPY WITH PROPOFOL;  Surgeon: Jonathon Bellows, MD;  Location: [facility] ENDOSCOPY;  Service: Gastroenterology;  Laterality: N/A;  . ESOPHAGOGASTRODUODENOSCOPY (EGD) WITH PROPOFOL N/A 11/19/2017   Procedure: ESOPHAGOGASTRODUODENOSCOPY (EGD) WITH PROPOFOL;  Surgeon: Jonathon Bellows, MD;  Location: [facility] ENDOSCOPY;  Service: Gastroenterology;  Laterality: N/A;  . ESOPHAGOGASTRODUODENOSCOPY (EGD) WITH PROPOFOL N/A 05/20/2018   Procedure: ESOPHAGOGASTRODUODENOSCOPY (EGD) WITH PROPOFOL;  Surgeon: Jonathon Bellows, MD;  Location: [facility] ENDOSCOPY;  Service: Gastroenterology;  Laterality: N/A;  . GIVENS CAPSULE STUDY N/A 05/20/2018   Procedure: GIVENS CAPSULE STUDY x 12 HR;  Surgeon: Jonathon Bellows, MD;  Location: [facility] ENDOSCOPY;  Service: Gastroenterology;  Laterality: N/A;  . ingrown toenail removal  1964  . NOSE SURGERY  1974   Home Medications    Prior to Admission medications   Medication Sig Start Date End Date Taking? Authorizing Provider  acetaminophen (TYLENOL) 500 MG tablet Take 1,250 mg by mouth.    [provider]  albuterol (ACCUNEB) 0.63 MG/3ML nebulizer solution Take 1 ampule by nebulization every 6 (six) hours as needed for wheezing.    [provider]  amLODipine (NORVASC) 10 MG tablet Take 10 mg by mouth every morning.  08/21/15   [provider]  atorvastatin (LIPITOR) 20 MG tablet Take by mouth. 10/23/17 09/13/19  [provider]  baclofen (LIORESAL) 10 MG tablet Take 1 tablet (10 mg total) by mouth 3 (three)  times daily as needed for muscle spasms. 03/14/20   Coral Spikes, DO  fluticasone (FLONASE) 50 MCG/ACT nasal spray Place 1 spray into both nostrils daily.  08/17/15   [provider]  fluticasone furoate-vilanterol (BREO ELLIPTA) 100-25 MCG/INH AEPB Inhale 1 puff into the lungs daily.    [provider]  Glucos-Chondroit-Hyaluron-MSM (GLUCOSAMINE CHONDROITIN JOINT) TABS Take  by mouth.    [provider]  HYDROcodone-acetaminophen (NORCO/VICODIN) 5-325 MG tablet Take 1 tablet by mouth every 8 (eight) hours as needed. 03/14/20   Coral Spikes, DO  Multiple Vitamins-Minerals (CENTRUM SILVER PO) Take by mouth once.    [provider]  niacin 500 MG tablet Take 500 mg by mouth at bedtime.    [provider]  Omega-3 Fatty Acids (FISH OIL) 1000 MG CAPS Take by mouth once.    [provider]  predniSONE (DELTASONE) 10 MG tablet 50 mg daily x 2 days, then 40 mg daily x 2 days, then 30 mg daily x 2 days, then 20 mg daily x 2 days, then 10 mg daily x 2 days. 03/14/20   Coral Spikes, DO  vitamin B-12 (CYANOCOBALAMIN) 1000 MCG tablet Take 1 tablet (1,000 mcg total) by mouth daily. 10/27/18   Earlie Server, MD  fexofenadine (ALLEGRA) 180 MG tablet Take 180 mg by mouth daily.  08/11/19  [provider]  Loratadine 10 MG CAPS Take 10 mg by mouth.  08/11/19  [provider]  omeprazole (PRILOSEC) 40 MG capsule Take 1 capsule (40 mg total) by mouth daily. 11/11/17 08/11/19  Jonathon Bellows, MD    Family History Family History  Problem Relation Age of Onset  . Cancer Father        kidney    Social History Social History   Tobacco Use  . Smoking status: Never Smoker  . Smokeless tobacco: Never Used  Substance Use Topics  . Alcohol use: No    Alcohol/week: 0.0 standard drinks  . Drug use: No     Allergies   Ace inhibitors, Chlorthalidone, and Pravastatin   Review of Systems Review of Systems  Constitutional: Negative.   Musculoskeletal: Positive for back pain.   Physical Exam Triage Vital Signs ED Triage Vitals [03/14/20 1550]  Enc Vitals Group     BP 131/71     Pulse Rate 82     Resp 18     Temp 98.7 F (37.1 C)     Temp Source Oral     SpO2 95 %     Weight 205 lb (93 kg)     Height 5\' 6"  (1.676 m)     Head Circumference      Peak Flow      Pain Score 8     Pain Loc      Pain Edu?      Excl. in [place]?     Updated Vital Signs BP 131/71 (BP Location: Left Arm)   Pulse 82   Temp 98.7 F (37.1 C) (Oral)   Resp 18   Ht 5\' 6"  (1.676 m)   Wt 93 kg   SpO2 95%   BMI 33.09 kg/m   Visual Acuity Right Eye Distance:   Left Eye Distance:   Bilateral Distance:    Right Eye Near:   Left Eye Near:    Bilateral Near:     Physical Exam Vitals and nursing note reviewed.  Constitutional:      General: He is not in acute distress.  Appearance: Normal appearance. He is not ill-appearing.  HENT:     Head: Normocephalic and atraumatic.  Eyes:     General:        Right eye: No discharge.        Left eye: No discharge.     Conjunctiva/sclera: Conjunctivae normal.  Cardiovascular:     Rate and Rhythm: Normal rate and regular rhythm.     Heart sounds: No murmur.  Pulmonary:     Effort: Pulmonary effort is normal.     Breath sounds: Normal breath sounds. No wheezing, rhonchi or rales.  Musculoskeletal:     Comments: Lumbar spine - paraspinal muscle tenderness to palpation.  Neurological:     Mental Status: He is alert.  Psychiatric:        Mood and Affect: Mood normal.        Behavior: Behavior normal.    UC Treatments / Results  Labs (all labs ordered are listed, but only abnormal results are displayed) Labs Reviewed - No data to display  EKG   Radiology DG Lumbar Spine Complete  Result Date: 03/14/2020 CLINICAL DATA:  Low back and right leg pain. EXAM: LUMBAR SPINE - COMPLETE 4+ VIEW COMPARISON:  None. FINDINGS: Minimal grade 1 retrolisthesis of L3-4 is noted secondary to mild degenerative disc disease at this level. Mild degenerative disc disease is also noted at L2-3. No fracture is noted. IMPRESSION: Mild multilevel degenerative disc disease. No acute abnormality seen in the lumbar spine. Electronically Signed   By: Marijo Conception M.D.   On: 03/14/2020 16:50    Procedures Procedures (including critical care time)  Medications Ordered in UC Medications  ketorolac  (TORADOL) 30 MG/ML injection 30 mg (30 mg Intramuscular Given 03/14/20 1612)    Initial Impression / Assessment and Plan / UC Course  I have reviewed the triage vital signs and the nursing notes.  Pertinent labs & imaging results that were available during my care of the patient were reviewed by me and considered in my medical decision making (see chart for details).    69 year old male presents with acute low back pain.  X-ray obtained given recurrent issues.  X-ray independently reviewed by me.  Interpretation: Degenerative disc disease noted.  No acute fracture.  Toradol given here with improvement in pain.  Sending home on prednisone taper, hydrocodone, and baclofen.  Final Clinical Impressions(s) / UC Diagnoses   Final diagnoses:  Acute bilateral low back pain with sciatica, sciatica laterality unspecified     Discharge Instructions     Medications as prescribed.  Rest. Heat.  Take care  Dr. Lacinda Axon    ED Prescriptions    Medication Sig Dispense Auth. Provider   baclofen (LIORESAL) 10 MG tablet Take 1 tablet (10 mg total) by mouth 3 (three) times daily as needed for muscle spasms. 30 each Coral Spikes, DO   HYDROcodone-acetaminophen (NORCO/VICODIN) 5-325 MG tablet Take 1 tablet by mouth every 8 (eight) hours as needed. 10 tablet Weslyn Holsonback G, DO   predniSONE (DELTASONE) 10 MG tablet 50 mg daily x 2 days, then 40 mg daily x 2 days, then 30 mg daily x 2 days, then 20 mg daily x 2 days, then 10 mg daily x 2 days. 30 tablet Thersa Salt G, DO     I have reviewed the PDMP during this encounter.   Coral Spikes, [place] 03/14/20 1727

## 2020-03-14 NOTE — Discharge Instructions (Signed)
Medications as prescribed.  Rest. Heat.  Take care  Dr. Lacinda Axon

## 2021-01-01 ENCOUNTER — Encounter: Payer: Self-pay | Admitting: *Deleted

## 2021-03-13 ENCOUNTER — Ambulatory Visit: Payer: Medicare Other | Admitting: Gastroenterology

## 2021-03-13 ENCOUNTER — Other Ambulatory Visit: Payer: Self-pay

## 2021-03-13 ENCOUNTER — Encounter: Payer: Self-pay | Admitting: Gastroenterology

## 2021-03-13 VITALS — BP 129/76 | HR 78 | Ht 67.0 in | Wt 203.0 lb

## 2021-03-13 DIAGNOSIS — R17 Unspecified jaundice: Secondary | ICD-10-CM | POA: Diagnosis not present

## 2021-03-13 DIAGNOSIS — D3A8 Other benign neuroendocrine tumors: Secondary | ICD-10-CM

## 2021-03-13 DIAGNOSIS — D509 Iron deficiency anemia, unspecified: Secondary | ICD-10-CM

## 2021-03-13 DIAGNOSIS — Z791 Long term (current) use of non-steroidal anti-inflammatories (NSAID): Secondary | ICD-10-CM | POA: Diagnosis not present

## 2021-03-13 NOTE — Progress Notes (Signed)
Jonathon Bellows MD, MRCP(U.K) [address]  [place]  [place], [place] [address]  Main: [phone]  Fax: [phone]   Gastroenterology Consultation  Referring Provider:     Gayland Curry, MD Primary Care Physician:  Gayland Curry, MD Primary Gastroenterologist:  Dr. Jonathon Bellows  Reason for Consultation:     Neuroendocrine tumor        HPI:   Ronnie Reyes is a 70 y.o. y/o male referred for consultation & management  by Dr. Gayland Curry, MD.     Summary of history : He was last seen at my office almost 3 years back in June 2019 for neuroendocrine tumor of the stomach.  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  He was following with Dr. Tasia Catchings in hematology.  H. pylori stool antigen and celiac serology were negative.  11/19/17- EGD: A few gastric sessile polyps were seen at the antrum and biopsies suggested intestinal metaplasia and chronic gastritis, atrophic gastritis-negative for H pylori . A 8 mm polyp was also seen close by -I only took sample biopsies from the edge WELL-DIFFERENTIATED NEUROENDOCRINE TUMOR, 2 MM. - LYMPHOVASCULAR INVASION. - TUMOR EXTENDS TO THE DEEP BIOPSY BASE. G1, Well-differentiated neuroendocrine tumor Mitotic Rate: 1 mitoses / 2 mm2,Ki-67 Labeling Index: <1 %,Tumor Extension: At least into the lamina propria,Margins: Tumor extends to the deep biopsy base   Colonoscopy- 2 tubular adenomas were excised. 10/2017 - Capsule study - poor prep and did not exit out 11/2017 Underwent EUS and EMR of gastric polyp- well differentiated NET 11/27/17- Gastrin significantly elevated at 1364  Repeat capsule study in 2019 showed some mucosal ulcerations and inflammation and was referred to [facility] for opinion.  Subsequently seen and evaluated at [facility] by Dr. Heide Guile and double-balloon enteroscopy performed November 2019, difficult procedure due to looping tattoo was placed at the distal point it was reached retrograde  enteroscopy performed in December 2019 sites of surface ulceration and found biopsies just showed ulceration and reactive changes.  It was felt that previously seen issues were had resolved.  Plan was for a CT enterography.  And plan was also to repeat possible capsule study down the road.  They try to see the plan for a video visit in December 2020 but the patient canceled the appointment.  Interval history December 2020-  03/13/2021  No further imaging performed labs in March 2022 hemoglobin 16.5 g with a normal BMP last iron studies were in March 2021 which showed a ferritin of 13 December 2018: CT enterography at [facility] showed abnormal appearance of left kidney with hydronephrosis sigmoid diverticulosis enlarged prostate.  He also had a capsule study on 12/14/2018 and was given a course of budesonide for 2 ilial ulcer seen.  He states that he has been doing well since last visit.  Some nausea he has every day in the morning denies any abdominal pain.  No change in weight.  Does not smoke.  He has been taking 4 ibuprofens for a large part of his life for back pain.  Denies any change in his bowel movements.  Past Medical History:  Diagnosis Date  . Anemia   . Asthma   . Cancer ([place]) 2003   skin cancer/arm and back  . Complication of anesthesia    HARD TIME WAKING UP AFTER  APPENDECTOMY  . GERD (gastroesophageal reflux disease)   . Hernia   . Hypertension 2005   since 2005    Past Surgical History:  Procedure Laterality Date  . ANAL FISTULOTOMY N/A 01/02/2016   Procedure: ANAL FISTULOTOMY;  Surgeon: Robert Bellow, MD;  Location: [facility] ORS;  Service: General;  Laterality: N/A;  . APPENDECTOMY  2000  . CHOLECYSTECTOMY  2006  . COLONOSCOPY  [id number],[id number]   small serrated adenoma removed from the rectum at 15cm. This was his first screening colonoscopy  . COLONOSCOPY WITH PROPOFOL N/A 01/02/2016   Procedure: COLONOSCOPY WITH PROPOFOL;  Surgeon: Robert Bellow, MD;  Location: [facility]  ENDOSCOPY;  Service: Endoscopy;  Laterality: N/A;  . COLONOSCOPY WITH PROPOFOL N/A 11/19/2017   Procedure: COLONOSCOPY WITH PROPOFOL;  Surgeon: Jonathon Bellows, MD;  Location: [facility] ENDOSCOPY;  Service: Gastroenterology;  Laterality: N/A;  . ESOPHAGOGASTRODUODENOSCOPY (EGD) WITH PROPOFOL N/A 11/19/2017   Procedure: ESOPHAGOGASTRODUODENOSCOPY (EGD) WITH PROPOFOL;  Surgeon: Jonathon Bellows, MD;  Location: [facility] ENDOSCOPY;  Service: Gastroenterology;  Laterality: N/A;  . ESOPHAGOGASTRODUODENOSCOPY (EGD) WITH PROPOFOL N/A 05/20/2018   Procedure: ESOPHAGOGASTRODUODENOSCOPY (EGD) WITH PROPOFOL;  Surgeon: Jonathon Bellows, MD;  Location: [facility] ENDOSCOPY;  Service: Gastroenterology;  Laterality: N/A;  . GIVENS CAPSULE STUDY N/A 05/20/2018   Procedure: GIVENS CAPSULE STUDY x 12 HR;  Surgeon: Jonathon Bellows, MD;  Location: [facility] ENDOSCOPY;  Service: Gastroenterology;  Laterality: N/A;  . ingrown toenail removal  1964  . NOSE SURGERY  1974    Prior to Admission medications   Medication Sig Start Date End Date Taking? Authorizing Provider  acetaminophen (TYLENOL) 500 MG tablet Take 1,250 mg by mouth.    [provider]  albuterol (ACCUNEB) 0.63 MG/3ML nebulizer solution Take 1 ampule by nebulization every 6 (six) hours as needed for wheezing.    [provider]  amLODipine (NORVASC) 10 MG tablet Take 10 mg by mouth every morning.  08/21/15   [provider]  atorvastatin (LIPITOR) 20 MG tablet Take by mouth. 10/23/17 09/13/19  [provider]  baclofen (LIORESAL) 10 MG tablet Take 1 tablet (10 mg total) by mouth 3 (three) times daily as needed for muscle spasms. 03/14/20   Coral Spikes, DO  fluticasone (FLONASE) 50 MCG/ACT nasal spray Place 1 spray into both nostrils daily.  08/17/15   [provider]  fluticasone furoate-vilanterol (BREO ELLIPTA) 100-25 MCG/INH AEPB Inhale 1 puff into the lungs daily.    [provider]  Glucos-Chondroit-Hyaluron-MSM (GLUCOSAMINE CHONDROITIN  JOINT) TABS Take by mouth.    [provider]  HYDROcodone-acetaminophen (NORCO/VICODIN) 5-325 MG tablet Take 1 tablet by mouth every 8 (eight) hours as needed. 03/14/20   Coral Spikes, DO  Multiple Vitamins-Minerals (CENTRUM SILVER PO) Take by mouth once.    [provider]  niacin 500 MG tablet Take 500 mg by mouth at bedtime.    [provider]  Omega-3 Fatty Acids (FISH OIL) 1000 MG CAPS Take by mouth once.    [provider]  predniSONE (DELTASONE) 10 MG tablet 50 mg daily x 2 days, then 40 mg daily x 2 days, then 30 mg daily x 2 days, then 20 mg daily x 2 days, then 10 mg daily x 2 days. 03/14/20   Coral Spikes, DO  vitamin B-12 (CYANOCOBALAMIN) 1000 MCG tablet Take 1 tablet (1,000 mcg total) by mouth daily. 10/27/18   Earlie Server, MD  fexofenadine (ALLEGRA) 180 MG tablet Take 180 mg by mouth daily.  08/11/19  [provider]  Loratadine 10 MG CAPS Take 10 mg by mouth.  08/11/19  [provider]  omeprazole (PRILOSEC) 40 MG capsule Take 1 capsule (40 mg total)  by mouth daily. 11/11/17 08/11/19  Jonathon Bellows, MD    Family History  Problem Relation Age of Onset  . Cancer Father        kidney     Social History   Tobacco Use  . Smoking status: Never Smoker  . Smokeless tobacco: Never Used  Vaping Use  . Vaping Use: Never used  Substance Use Topics  . Alcohol use: No    Alcohol/week: 0.0 standard drinks  . Drug use: No    Allergies as of 03/13/2021 - Review Complete 03/13/2021  Allergen Reaction Noted  . Ace inhibitors Shortness Of Breath 08/28/2015  . Chlorthalidone Anaphylaxis 08/28/2015  . Pravastatin Rash 04/02/2017    Review of Systems:    All systems reviewed and negative except where noted in HPI.   Physical Exam:  BP 129/76 (BP Location: Right Arm, Patient Position: Sitting, Cuff Size: Large)   Pulse 78   Ht 5' 7" (1.702 m)   Wt 203 lb (92.1 kg)   BMI 31.79 kg/m  No LMP for male patient. Psych:  Alert and  cooperative. Normal mood and affect. General:   Alert,  Well-developed, well-nourished, pleasant and cooperative in NAD Head:  Normocephalic and atraumatic. Eyes:  Sclera clear, no icterus.   Conjunctiva pink. Ears:  Normal auditory acuity. Nose:  No deformity, discharge, or lesions. Lungs:  Respirations even and unlabored.  Clear throughout to auscultation.   No wheezes, crackles, or rhonchi. No acute distress. Heart:  Regular rate and rhythm; no murmurs, clicks, rubs, or gallops. Abdomen:  Normal bowel sounds.  No bruits.  Soft, non-tender and non-distended without masses, hepatosplenomegaly or hernias noted.  No guarding or rebound tenderness.    Neurologic:  Alert and oriented x3;  grossly normal neurologically. Psych:  Alert and cooperative. Normal mood and affect.  Imaging Studies: No results found.  Assessment and Plan:   Ronnie Reyes is a 70 y.o. y/o male has been referred for here to reestablish care.  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 Likely atrophic gastritis cause for impaired iron absorption. Incidentally an 8 mm polyp seen in the stomach , I took a biopsy from the edge as the appearance was odd, pathology suggests well differentiated neurodendocrine tumor with lymphovascular invasion , low mitotic rate.Elevated Gastrin, NET is Type 1 associated with atrophic gastritis and since <1 cm usually has good prognosis . S/p EMR and resected of tumor at [facility] in 11/2017 .underwent double-balloon enteroscopy at [facility] and ulceration seen on capsule study was found to be benign.  CT enterography did not show any obstruction, strictures and he had a repeat capsule that showed few ulcers in the terminal ileum.  At that time he was given a course of budesonide and was planned for reassessment  subsequently but did not follow-up.  Today he mentions he has been taking 4 ibuprofens for a significant portion of his life which would explain the ulcerations in the small bowel.   Plan : 1.Atrophic gastritis likely cause of iron deficiency anemia, presently not anemic 2. Elevated total bilirubin likely benign.  Will obtain fractionated 3. Repeat EGD for Gastric mappingin view of intestinal metaplasia as well as evaluation of EMR site where NET resected.  4. Colonoscopy in 2024 for polyp surveillance 5. If EGD shows any abnormality then we will get in  contact with Dr. Tasia Catchings for follow-up 6.  Strongly advised him to stop all NSAIDs as it can be life-threatening.  Very likely his nausea is due to NSAID usage  Endocrinology     Follow up in 4 months   Dr Jonathon Bellows MD,MRCP(U.K)

## 2021-03-14 LAB — BILIRUBIN, FRACTIONATED(TOT/DIR/INDIR)
Bilirubin Total: 0.8 mg/dL (ref 0.0–1.2)
Bilirubin, Direct: 0.2 mg/dL (ref 0.00–0.40)
Bilirubin, Indirect: 0.6 mg/dL (ref 0.10–0.80)

## 2021-03-15 ENCOUNTER — Telehealth: Payer: Self-pay

## 2021-03-15 NOTE — Telephone Encounter (Signed)
Informed patient's wife of the lab results. She stated they had viewed them on my chart. Wife verbalized understanding.

## 2021-03-15 NOTE — Telephone Encounter (Signed)
-----   Message from Jonathon Bellows, MD sent at 03/14/2021 10:48 AM EDT ----- Inform patient Bilirubin is normal

## 2021-04-08 ENCOUNTER — Encounter
Admission: RE | Disposition: A | Discharge status: Home / Self Care | Payer: Self-pay | Source: Home / Self Care | Attending: Gastroenterology

## 2021-04-08 ENCOUNTER — Ambulatory Visit: Payer: Medicare Other | Admitting: Certified Registered Nurse Anesthetist

## 2021-04-08 ENCOUNTER — Ambulatory Visit
Admission: RE | Admit: 2021-04-08 | Discharge: 2021-04-08 | Disposition: A | Discharge status: Home / Self Care | Payer: Medicare Other | Attending: Gastroenterology | Admitting: Gastroenterology

## 2021-04-08 DIAGNOSIS — Z9049 Acquired absence of other specified parts of digestive tract: Secondary | ICD-10-CM | POA: Insufficient documentation

## 2021-04-08 DIAGNOSIS — Z888 Allergy status to other drugs, medicaments and biological substances status: Secondary | ICD-10-CM | POA: Diagnosis not present

## 2021-04-08 DIAGNOSIS — Z8051 Family history of malignant neoplasm of kidney: Secondary | ICD-10-CM | POA: Insufficient documentation

## 2021-04-08 DIAGNOSIS — D3A8 Other benign neuroendocrine tumors: Secondary | ICD-10-CM | POA: Diagnosis not present

## 2021-04-08 DIAGNOSIS — Z85828 Personal history of other malignant neoplasm of skin: Secondary | ICD-10-CM | POA: Diagnosis not present

## 2021-04-08 DIAGNOSIS — K2940 Chronic atrophic gastritis without bleeding: Secondary | ICD-10-CM | POA: Insufficient documentation

## 2021-04-08 DIAGNOSIS — Z79899 Other long term (current) drug therapy: Secondary | ICD-10-CM | POA: Insufficient documentation

## 2021-04-08 DIAGNOSIS — K31A0 Gastric intestinal metaplasia, unspecified: Secondary | ICD-10-CM | POA: Diagnosis not present

## 2021-04-08 DIAGNOSIS — K3189 Other diseases of stomach and duodenum: Secondary | ICD-10-CM | POA: Insufficient documentation

## 2021-04-08 DIAGNOSIS — T182XXA Foreign body in stomach, initial encounter: Secondary | ICD-10-CM | POA: Insufficient documentation

## 2021-04-08 HISTORY — PX: ESOPHAGOGASTRODUODENOSCOPY (EGD) WITH PROPOFOL: SHX5813

## 2021-04-08 SURGERY — ESOPHAGOGASTRODUODENOSCOPY (EGD) WITH PROPOFOL
Anesthesia: General

## 2021-04-08 MED ORDER — LIDOCAINE HCL (CARDIAC) PF 100 MG/5ML IV SOSY
PREFILLED_SYRINGE | INTRAVENOUS | Status: DC | PRN
  Administered 2021-04-08: 100 mg via INTRAVENOUS

## 2021-04-08 MED ORDER — LIDOCAINE HCL (PF) 1 % IJ SOLN
INTRAMUSCULAR | Status: AC
  Filled 2021-04-08: qty 2

## 2021-04-08 MED ORDER — PROPOFOL 500 MG/50ML IV EMUL
INTRAVENOUS | Status: AC
  Filled 2021-04-08: qty 100

## 2021-04-08 MED ORDER — GLYCOPYRROLATE 0.2 MG/ML IJ SOLN
INTRAMUSCULAR | Status: DC | PRN
  Administered 2021-04-08: .2 mg via INTRAVENOUS

## 2021-04-08 MED ORDER — SODIUM CHLORIDE 0.9 % IV SOLN
INTRAVENOUS | Status: DC
  Administered 2021-04-08: 1000 mL via INTRAVENOUS

## 2021-04-08 MED ORDER — PROPOFOL 500 MG/50ML IV EMUL
INTRAVENOUS | Status: DC | PRN
  Administered 2021-04-08: 170 ug/kg/min via INTRAVENOUS

## 2021-04-08 MED ORDER — LIDOCAINE HCL (PF) 2 % IJ SOLN
INTRAMUSCULAR | Status: AC
  Filled 2021-04-08: qty 2

## 2021-04-08 MED ORDER — PROPOFOL 10 MG/ML IV BOLUS
INTRAVENOUS | Status: DC | PRN
  Administered 2021-04-08: 80 mg via INTRAVENOUS

## 2021-04-08 NOTE — Anesthesia Postprocedure Evaluation (Signed)
Anesthesia Post Note  Patient: Ronnie Reyes  Procedure(s) Performed: ESOPHAGOGASTRODUODENOSCOPY (EGD) WITH PROPOFOL  Patient location during evaluation: Phase II Anesthesia Type: General Level of consciousness: awake and alert, awake and oriented Pain management: pain level controlled Vital Signs Assessment: post-procedure vital signs reviewed and stable Respiratory status: spontaneous breathing, nonlabored ventilation and respiratory function stable Cardiovascular status: blood pressure returned to baseline and stable Postop Assessment: no apparent nausea or vomiting Anesthetic complications: no   No notable events documented.   Last Vitals:  Vitals:   04/08/21 1036 04/08/21 1046  BP: 116/74 118/82  Pulse:    Resp:    Temp:    SpO2:      Last Pain:  Vitals:   04/08/21 1036  TempSrc:   PainSc: 0-No pain                 Phill Mutter

## 2021-04-08 NOTE — Anesthesia Procedure Notes (Signed)
Date/Time: 04/08/2021 10:16 AM Performed by: Lily Peer, Alvah Lagrow, CRNA Pre-anesthesia Checklist: Patient identified, Emergency Drugs available, Suction available, Patient being monitored and Timeout performed Patient Re-evaluated:Patient Re-evaluated prior to induction Oxygen Delivery Method: Nasal cannula Induction Type: IV induction

## 2021-04-08 NOTE — H&P (Signed)
Jonathon Bellows, MD [address], [place], [place], [place], [address] [address], [place], [place], [place], [address] Phone: [phone]  Fax: [phone]  Primary Care Physician:  Gayland Curry, MD   Pre-Procedure History & Physical: HPI:  Ronnie Reyes is a 70 y.o. male is here for an endoscopy    Past Medical History:  Diagnosis Date   Anemia    Asthma    Cancer ([place]) 2003   skin cancer/arm and back   Complication of anesthesia    HARD TIME WAKING UP AFTER  APPENDECTOMY   GERD (gastroesophageal reflux disease)    Hernia    Hypertension 2005   since 2005    Past Surgical History:  Procedure Laterality Date   ANAL FISTULOTOMY N/A 01/02/2016   Procedure: ANAL FISTULOTOMY;  Surgeon: Robert Bellow, MD;  Location: [facility] ORS;  Service: General;  Laterality: N/A;   APPENDECTOMY  2000   CHOLECYSTECTOMY  2006   COLONOSCOPY  [id number]   small serrated adenoma removed from the rectum at 15cm. This was his first screening colonoscopy   COLONOSCOPY WITH PROPOFOL N/A 01/02/2016   Procedure: COLONOSCOPY WITH PROPOFOL;  Surgeon: Robert Bellow, MD;  Location: [facility] ENDOSCOPY;  Service: Endoscopy;  Laterality: N/A;   COLONOSCOPY WITH PROPOFOL N/A 11/19/2017   Procedure: COLONOSCOPY WITH PROPOFOL;  Surgeon: Jonathon Bellows, MD;  Location: [facility] ENDOSCOPY;  Service: Gastroenterology;  Laterality: N/A;   ESOPHAGOGASTRODUODENOSCOPY (EGD) WITH PROPOFOL N/A 11/19/2017   Procedure: ESOPHAGOGASTRODUODENOSCOPY (EGD) WITH PROPOFOL;  Surgeon: Jonathon Bellows, MD;  Location: [facility] ENDOSCOPY;  Service: Gastroenterology;  Laterality: N/A;   ESOPHAGOGASTRODUODENOSCOPY (EGD) WITH PROPOFOL N/A 05/20/2018   Procedure: ESOPHAGOGASTRODUODENOSCOPY (EGD) WITH PROPOFOL;  Surgeon: Jonathon Bellows, MD;  Location: [facility] ENDOSCOPY;  Service: Gastroenterology;  Laterality: N/A;   GIVENS CAPSULE STUDY N/A 05/20/2018   Procedure: GIVENS CAPSULE STUDY x 12 HR;  Surgeon: Jonathon Bellows, MD;  Location: [facility] ENDOSCOPY;   Service: Gastroenterology;  Laterality: N/A;   ingrown toenail removal  1964   NOSE SURGERY  1974    Prior to Admission medications   Medication Sig Start Date End Date Taking? Authorizing Provider  albuterol (ACCUNEB) 0.63 MG/3ML nebulizer solution Take 1 ampule by nebulization every 6 (six) hours as needed for wheezing.   Yes [provider]  acetaminophen (TYLENOL) 500 MG tablet Take 1,250 mg by mouth.    [provider]  amLODipine (NORVASC) 10 MG tablet Take 10 mg by mouth every morning.  08/21/15   [provider]  atorvastatin (LIPITOR) 20 MG tablet Take by mouth. 10/23/17 09/13/19  [provider]  baclofen (LIORESAL) 10 MG tablet Take 1 tablet (10 mg total) by mouth 3 (three) times daily as needed for muscle spasms. 03/14/20   Coral Spikes, DO  fluticasone (FLONASE) 50 MCG/ACT nasal spray Place 1 spray into both nostrils daily.  08/17/15   [provider]  fluticasone furoate-vilanterol (BREO ELLIPTA) 100-25 MCG/INH AEPB Inhale 1 puff into the lungs daily.    [provider]  Glucos-Chondroit-Hyaluron-MSM (GLUCOSAMINE CHONDROITIN JOINT) TABS Take by mouth.    [provider]  HYDROcodone-acetaminophen (NORCO/VICODIN) 5-325 MG tablet Take 1 tablet by mouth every 8 (eight) hours as needed. 03/14/20   Coral Spikes, DO  Multiple Vitamins-Minerals (CENTRUM SILVER PO) Take by mouth once.    [provider]  niacin 500 MG tablet Take 500 mg by mouth at bedtime.    [provider]  Omega-3 Fatty Acids (FISH OIL) 1000 MG CAPS  Take by mouth once.    [provider]  predniSONE (DELTASONE) 10 MG tablet 50 mg daily x 2 days, then 40 mg daily x 2 days, then 30 mg daily x 2 days, then 20 mg daily x 2 days, then 10 mg daily x 2 days. 03/14/20   Coral Spikes, DO  vitamin B-12 (CYANOCOBALAMIN) 1000 MCG tablet Take 1 tablet (1,000 mcg total) by mouth daily. 10/27/18   Earlie Server, MD  fexofenadine (ALLEGRA) 180 MG tablet  Take 180 mg by mouth daily.  08/11/19  [provider]  Loratadine 10 MG CAPS Take 10 mg by mouth.  08/11/19  [provider]  omeprazole (PRILOSEC) 40 MG capsule Take 1 capsule (40 mg total) by mouth daily. 11/11/17 08/11/19  Jonathon Bellows, MD    Allergies as of 03/13/2021 - Review Complete 03/13/2021  Allergen Reaction Noted   Ace inhibitors Shortness Of Breath 08/28/2015   Chlorthalidone Anaphylaxis 08/28/2015   Pravastatin Rash 04/02/2017    Family History  Problem Relation Age of Onset   Cancer Father        kidney    Social History   Socioeconomic History   Marital status: Married    Spouse name: Not on file   Number of children: Not on file   Years of education: Not on file   Highest education level: Not on file  Occupational History   Not on file  Tobacco Use   Smoking status: Never   Smokeless tobacco: Never  Vaping Use   Vaping Use: Never used  Substance and Sexual Activity   Alcohol use: No    Alcohol/week: 0.0 standard drinks   Drug use: No   Sexual activity: Yes  Other Topics Concern   Not on file  Social History Narrative   Not on file   Social Determinants of Health   Financial Resource Strain: Not on file  Food Insecurity: Not on file  Transportation Needs: Not on file  Physical Activity: Not on file  Stress: Not on file  Social Connections: Not on file  Intimate Partner Violence: Not on file    Review of Systems: See HPI, otherwise negative ROS  Physical Exam: BP (!) 141/89   Pulse 66   Temp 97.6 F (36.4 C) (Temporal)   Resp 17   Ht 5\' 7"  (1.702 m)   Wt 88.5 kg   SpO2 99%   BMI 30.54 kg/m  General:   Alert,  pleasant and cooperative in NAD Head:  Normocephalic and atraumatic. Neck:  Supple; no masses or thyromegaly. Lungs:  Clear throughout to auscultation, normal respiratory effort.    Heart:  +S1, +S2, Regular rate and rhythm, No edema. Abdomen:  Soft, nontender and nondistended. Normal bowel sounds, without  guarding, and without rebound.   Neurologic:  Alert and  oriented x4;  grossly normal neurologically.  Impression/Plan: Ronnie Reyes is here for an endoscopy  to be performed for  evaluation of gastric intestinal metaplasia    Risks, benefits, limitations, and alternatives regarding endoscopy have been reviewed with the patient.  Questions have been answered.  All parties agreeable.   Jonathon Bellows, MD  04/08/2021, 9:51 AM

## 2021-04-08 NOTE — Anesthesia Preprocedure Evaluation (Signed)
Anesthesia Evaluation  Patient identified by MRN, date of birth, ID band Patient awake    Reviewed: Allergy & Precautions, H&P , NPO status , reviewed documented beta blocker date and time   History of Anesthesia Complications (+) history of anesthetic complications  Airway Mallampati: II  TM Distance: >3 FB Neck ROM: full    Dental  (+) Chipped, Missing, Caps   Pulmonary asthma , COPD,  COPD inhaler,    Pulmonary exam normal        Cardiovascular hypertension, Pt. on medications Normal cardiovascular exam     Neuro/Psych    GI/Hepatic GERD  Medicated and Controlled,  Endo/Other    Renal/GU Renal disease     Musculoskeletal   Abdominal (+) + obese,   Peds  Hematology  (+) anemia ,   Anesthesia Other Findings Anemia Asthma 2003: Cancer ([place])     Comment:  skin cancer/arm and back No date: Complication of anesthesia     Comment:  HARD TIME WAKING UP AFTER  APPENDECTOMY GERD (gastroesophageal reflux disease) Hernia 2005: Hypertension     Comment:  since 2005 Past Surgical History: 01/02/2016: ANAL FISTULOTOMY; N/A     Comment:  Procedure: ANAL FISTULOTOMY;  Surgeon: Robert Bellow, MD;  Location: [facility] ORS;  Service: General;                Laterality: N/A; 2000: APPENDECTOMY 2006: CHOLECYSTECTOMY [id number]: COLONOSCOPY     Comment:  small serrated adenoma removed from the rectum at 15cm.               This was his first screening colonoscopy 01/02/2016: COLONOSCOPY WITH PROPOFOL; N/A     Comment:  Procedure: COLONOSCOPY WITH PROPOFOL;  Surgeon: Robert Bellow, MD;  Location: [facility] ENDOSCOPY;  Service:               Endoscopy;  Laterality: N/A; 11/19/2017: COLONOSCOPY WITH PROPOFOL; N/A     Comment:  Procedure: COLONOSCOPY WITH PROPOFOL;  Surgeon: Jonathon Bellows, MD;  Location: [facility] ENDOSCOPY;  Service:               Gastroenterology;  Laterality:  N/A; 11/19/2017: ESOPHAGOGASTRODUODENOSCOPY (EGD) WITH PROPOFOL; N/A     Comment:  Procedure: ESOPHAGOGASTRODUODENOSCOPY (EGD) WITH               PROPOFOL;  Surgeon: Jonathon Bellows, MD;  Location: [facility]               ENDOSCOPY;  Service: Gastroenterology;  Laterality: N/A; 1964: ingrown toenail removal 1974: NOSE SURGERY  BMI    Body Mass Index:  31.96 kg/m      Reproductive/Obstetrics                            Anesthesia Physical  Anesthesia Plan  ASA: 3  Anesthesia Plan: General   Post-op Pain Management:    Induction: Intravenous  PONV Risk Score and Plan: 2 and TIVA and Propofol infusion  Airway Management Planned: Nasal Cannula and Natural Airway  Additional Equipment:   Intra-op Plan:   Post-operative Plan:   Informed Consent: I have reviewed the patients History and Physical, chart, labs and discussed the procedure including the risks, benefits and alternatives for the  proposed anesthesia with the patient or authorized representative who has indicated his/her understanding and acceptance.     Dental Advisory Given  Plan Discussed with: CRNA, Anesthesiologist and Surgeon  Anesthesia Plan Comments:        Anesthesia Quick Evaluation

## 2021-04-08 NOTE — Op Note (Signed)
[facility] Gastroenterology Patient Name: Ronnie Reyes Procedure Date: 04/08/2021 9:50 AM MRN: [id number] Account #: [id number] Date of Birth: 06/24/1951 Admit Type: Outpatient Age: 70 Room: [facility] ENDO ROOM 4 Gender: Male Note Status: Finalized Procedure:             Upper GI endoscopy Indications:           For endoscopic therapy of intestinal metaplasia Providers:             Jonathon Bellows MD, MD Referring MD:          Gayland Curry MD, MD (Referring MD) Medicines:             Monitored Anesthesia Care Complications:         No immediate complications. Procedure:             Pre-Anesthesia Assessment:                        - Prior to the procedure, a History and Physical was                         performed, and patient medications, allergies and                         sensitivities were reviewed. The patient's tolerance                         of previous anesthesia was reviewed.                        - The risks and benefits of the procedure and the                         sedation options and risks were discussed with the                         patient. All questions were answered and informed                         consent was obtained.                        - ASA Grade Assessment: II - A patient with mild                         systemic disease.                        After obtaining informed consent, the endoscope was                         passed under direct vision. Throughout the procedure,                         the patient's blood pressure, pulse, and oxygen                         saturations were monitored continuously. The Endoscope                         was introduced  through the mouth, and advanced to the                         third part of duodenum. The upper GI endoscopy was                         accomplished with ease. The patient tolerated the                         procedure well. Findings:      The esophagus was  normal.      The examined duodenum was normal.      Diffuse severe inflammation characterized by congestion (edema),       erythema and granularity was found in the entire examined stomach.       Several biopsies were obtained with cold forceps for histology in the       entire examined stomach.      An endoclip was found on the greater curvature of the stomach. Likely       clip from old EMR, surrounding music seems normal Impression:            - Normal esophagus.                        - Normal examined duodenum.                        - Gastritis.                        - An endoclip was found in the stomach.                        - Several biopsies were obtained in the entire                         examined stomach. Recommendation:        - Discharge patient to home (with escort).                        - Resume previous diet.                        - Continue present medications.                        - Await pathology results.                        - Repeat upper endoscopy for surveillance based on                         pathology results. Procedure Code(s):     --- Professional ---                        [phone], Esophagogastroduodenoscopy, flexible,                         transoral; with biopsy, single or multiple Diagnosis Code(s):     --- Professional ---  T18.2XXA, Foreign body in stomach, initial encounter                        K29.70, Gastritis, unspecified, without bleeding                        K31.89, Other diseases of stomach and duodenum CPT copyright 2019 [employer] Association. All rights reserved. The codes documented in this report are preliminary and upon coder review may  be revised to meet current compliance requirements. Jonathon Bellows, MD Jonathon Bellows MD, MD 04/08/2021 10:15:21 AM This report has been signed electronically. Number of Addenda: 0 Note Initiated On: 04/08/2021 9:50 AM Estimated Blood Loss:  Estimated blood loss:  none.      [facility]

## 2021-04-08 NOTE — Transfer of Care (Signed)
Immediate Anesthesia Transfer of Care Note  Patient: Ronnie Reyes  Procedure(s) Performed: ESOPHAGOGASTRODUODENOSCOPY (EGD) WITH PROPOFOL  Patient Location: Endoscopy Unit  Anesthesia Type:General  Level of Consciousness: drowsy  Airway & Oxygen Therapy: Patient Spontanous Breathing  Post-op Assessment: Report given to RN and Post -op Vital signs reviewed and stable  Post vital signs: Reviewed and stable  Last Vitals:  Vitals Value Taken Time  BP 108/97 04/08/21 1015  Temp    Pulse 73 04/08/21 1017  Resp 33 04/08/21 1017  SpO2 97 % 04/08/21 1017  Vitals shown include unvalidated device data.  Last Pain:  Vitals:   04/08/21 0903  TempSrc: Temporal  PainSc: 4          Complications: No notable events documented.

## 2021-04-09 ENCOUNTER — Encounter: Payer: Self-pay | Admitting: Gastroenterology

## 2021-04-09 ENCOUNTER — Telehealth: Payer: Self-pay | Admitting: Gastroenterology

## 2021-04-09 NOTE — Telephone Encounter (Signed)
Patient's wife would like a call back to discuss procedure and results.

## 2021-04-10 LAB — SURGICAL PATHOLOGY

## 2021-04-10 NOTE — Telephone Encounter (Signed)
Results are not back yet. I will call her once I have them.

## 2021-04-16 NOTE — Telephone Encounter (Signed)
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 Can you please review and let me know what to tell them. Thank you.

## 2021-04-16 NOTE — Telephone Encounter (Signed)
Inform biopsies show everything is stable , no new changes, repeat EGD in 3 years.   Dr Vicente Males

## 2021-04-16 NOTE — Telephone Encounter (Signed)
Patient wife is on the phone and wants to talk to someone because she has not receive a call back since last week

## 2021-04-16 NOTE — Telephone Encounter (Signed)
Called Ronnie Reyes back to let her know that everything was stable and that there was no new changes and that he will need a 3 year repeat on his EGD. She understood.

## 2021-04-24 ENCOUNTER — Telehealth: Payer: Self-pay

## 2021-04-24 NOTE — Telephone Encounter (Signed)
-----   Message from Jonathon Bellows, MD sent at 04/23/2021 12:36 PM EDT ----- Inform that the biopsies of the stomach showed chronic inactive gastritis which he has had in the past and intestinal metaplasia which we have also known that he has had in the past.  I would suggest repeat endoscopy with biopsies in 3 years

## 2021-04-24 NOTE — Telephone Encounter (Signed)
Put a recall for a egd in 3 years. Sent mychart message

## 2021-06-06 ENCOUNTER — Other Ambulatory Visit: Payer: Self-pay | Admitting: Student

## 2021-06-06 ENCOUNTER — Other Ambulatory Visit (HOSPITAL_COMMUNITY): Payer: Self-pay | Admitting: Student

## 2021-06-06 DIAGNOSIS — M533 Sacrococcygeal disorders, not elsewhere classified: Secondary | ICD-10-CM

## 2021-06-06 DIAGNOSIS — M5416 Radiculopathy, lumbar region: Secondary | ICD-10-CM

## 2021-06-14 ENCOUNTER — Ambulatory Visit: Payer: Medicare Other

## 2021-06-15 ENCOUNTER — Other Ambulatory Visit: Payer: Self-pay

## 2021-06-15 ENCOUNTER — Ambulatory Visit
Admission: RE | Admit: 2021-06-15 | Discharge: 2021-06-15 | Disposition: A | Discharge status: Home / Self Care | Payer: Medicare Other | Source: Ambulatory Visit | Attending: Student | Admitting: Student

## 2021-06-15 DIAGNOSIS — M533 Sacrococcygeal disorders, not elsewhere classified: Secondary | ICD-10-CM | POA: Diagnosis not present

## 2021-06-15 DIAGNOSIS — M5416 Radiculopathy, lumbar region: Secondary | ICD-10-CM | POA: Insufficient documentation

## 2021-06-22 ENCOUNTER — Other Ambulatory Visit: Payer: Self-pay

## 2021-06-22 ENCOUNTER — Ambulatory Visit
Admission: RE | Admit: 2021-06-22 | Discharge: 2021-06-22 | Disposition: A | Discharge status: Home / Self Care | Payer: Medicare Other | Source: Ambulatory Visit | Attending: Physician Assistant | Admitting: Physician Assistant

## 2021-06-22 VITALS — BP 134/77 | HR 90 | Temp 98.3°F | Resp 16 | Ht 67.0 in | Wt 198.0 lb

## 2021-06-22 DIAGNOSIS — G8929 Other chronic pain: Secondary | ICD-10-CM

## 2021-06-22 DIAGNOSIS — M5441 Lumbago with sciatica, right side: Secondary | ICD-10-CM

## 2021-06-22 DIAGNOSIS — J441 Chronic obstructive pulmonary disease with (acute) exacerbation: Secondary | ICD-10-CM

## 2021-06-22 MED ORDER — AZITHROMYCIN 250 MG PO TABS: 250.0000 mg | ORAL_TABLET | Freq: Every day | ORAL | 0 refills | Status: DC

## 2021-06-22 MED ORDER — HYDROCODONE-ACETAMINOPHEN 5-325 MG PO TABS: 1.0000 | ORAL_TABLET | Freq: Four times a day (QID) | ORAL | 0 refills | Status: AC | PRN

## 2021-06-22 MED ORDER — DEXAMETHASONE SODIUM PHOSPHATE 10 MG/ML IJ SOLN
10.0000 mg | Freq: Once | INTRAMUSCULAR | Status: AC
  Administered 2021-06-22: 10 mg via INTRAMUSCULAR

## 2021-06-22 MED ORDER — METHYLPREDNISOLONE 4 MG PO TBPK: ORAL_TABLET | ORAL | 0 refills | Status: DC

## 2021-06-22 NOTE — ED Provider Notes (Signed)
[facility]    CSN: [id number] Arrival date & time: 06/22/21  1235      History   Chief Complaint Chief Complaint  Patient presents with   Back Pain    HPI Ronnie Reyes is a 70 y.o. male presenting with his wife for increased lower back pain with radiation to the right leg x2 days.  Patient has a history of chronic back pain and has does go to [facility].  He has been having this pain for about 2 years. He recently had an MRI and is awaiting a referral to physiatry for consideration of epidural corticosteroid injections.  Patient says about every 6 months he has a significant flareup of his pain and needs to have oral and IM corticosteroids.  Patient says he was last treated with corticosteroids in February of this year.  Presently he denies any numbness but says he has some tingling of the right thigh.  Reduced range of motion of back.  Increased pain when he lifts his right leg.  Increased pain on sitting.  Admits to issues with coccygeal pain that he is also been seeing orthopedics for.  Patient has taken hydrocodone that he had at home from a previous prescription in February 2022.  He says he has about 3 or 4 pills left.  No red flag signs or symptoms.  Denies any saddle anesthesia, leg weakness or loss of bowel or bladder control.  No other complaints or concerns.  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   Allergic state 11/02/2017   Hyperlipidemia, unspecified 11/02/2017   Moderate persistent asthma without complication [id number]   Iron deficiency anemia 11/02/2017   Renal cyst, acquired 06/01/2017   Abdominal  pain 05/18/2017   Cancer ([place])    Anemia 08/04/2012    Past Surgical History:  Procedure Laterality Date   ANAL FISTULOTOMY N/A 01/02/2016   Procedure: ANAL FISTULOTOMY;  Surgeon: Robert Bellow, MD;  Location: [facility] ORS;  Service: General;  Laterality: N/A;   APPENDECTOMY  2000   CHOLECYSTECTOMY  2006   COLONOSCOPY  [id number]   small serrated adenoma removed from the rectum at 15cm. This was his first screening colonoscopy   COLONOSCOPY WITH PROPOFOL N/A 01/02/2016   Procedure: COLONOSCOPY WITH PROPOFOL;  Surgeon: Robert Bellow, MD;  Location: [facility] ENDOSCOPY;  Service: Endoscopy;  Laterality: N/A;   COLONOSCOPY WITH PROPOFOL N/A 11/19/2017   Procedure: COLONOSCOPY WITH PROPOFOL;  Surgeon: Jonathon Bellows, MD;  Location: [facility] ENDOSCOPY;  Service: Gastroenterology;  Laterality: N/A;   ESOPHAGOGASTRODUODENOSCOPY (EGD) WITH PROPOFOL N/A 11/19/2017   Procedure: ESOPHAGOGASTRODUODENOSCOPY (EGD) WITH PROPOFOL;  Surgeon: Jonathon Bellows, MD;  Location: [facility] ENDOSCOPY;  Service: Gastroenterology;  Laterality: N/A;   ESOPHAGOGASTRODUODENOSCOPY (EGD) WITH PROPOFOL N/A 05/20/2018   Procedure: ESOPHAGOGASTRODUODENOSCOPY (EGD) WITH PROPOFOL;  Surgeon: Jonathon Bellows, MD;  Location: [facility] ENDOSCOPY;  Service: Gastroenterology;  Laterality: N/A;   ESOPHAGOGASTRODUODENOSCOPY (EGD) WITH PROPOFOL N/A 04/08/2021   Procedure: ESOPHAGOGASTRODUODENOSCOPY (EGD) WITH PROPOFOL;  Surgeon: Jonathon Bellows, MD;  Location: [facility] ENDOSCOPY;  Service: Gastroenterology;  Laterality: N/A;   GIVENS CAPSULE  STUDY N/A 05/20/2018   Procedure: GIVENS CAPSULE STUDY x 12 HR;  Surgeon: Jonathon Bellows, MD;  Location: [facility] ENDOSCOPY;  Service: Gastroenterology;  Laterality: N/A;   ingrown toenail removal  [place] Medications    Prior to Admission medications   Medication Sig Start Date End Date Taking? Authorizing Provider  amLODipine (NORVASC) 10 MG tablet Take 10 mg by mouth every morning.  08/21/15  Yes [provider]  atorvastatin (LIPITOR) 20 MG tablet Take by mouth. 10/23/17 06/22/21 Yes [provider]  azithromycin (ZITHROMAX) 250 MG tablet Take 1 tablet (250 mg total) by mouth daily. Take first 2 tablets together, then 1 every day until finished. 06/22/21  Yes Laurene Footman B, PA-C  fluticasone (FLONASE) 50 MCG/ACT nasal spray Place 1 spray into both nostrils daily.  08/17/15  Yes [provider]  fluticasone furoate-vilanterol (BREO ELLIPTA) 100-25 MCG/INH AEPB Inhale 1 puff into the lungs daily.   Yes [provider]  Glucos-Chondroit-Hyaluron-MSM (GLUCOSAMINE CHONDROITIN JOINT) TABS Take by mouth.   Yes [provider]  HYDROcodone-acetaminophen (NORCO/VICODIN) 5-325 MG tablet Take 1 tablet by mouth every 6 (six) hours as needed for up to 3 days. 06/22/21 06/25/21 Yes Danton Clap, PA-C  methylPREDNISolone (MEDROL DOSEPAK) 4 MG TBPK tablet Take 6 tabs PO on day 1 and decrease by 1 tab daily until complete 06/22/21  Yes Danton Clap, PA-C  Multiple Vitamins-Minerals (CENTRUM SILVER PO) Take by mouth once.   Yes [provider]  niacin 500 MG tablet Take 500 mg by mouth at bedtime.   Yes [provider]  vitamin B-12 (CYANOCOBALAMIN) 1000 MCG tablet Take 1 tablet (1,000 mcg total) by mouth daily. 10/27/18  Yes Earlie Server, MD  acetaminophen (TYLENOL) 500 MG tablet Take 1,250 mg by mouth.    [provider]  albuterol (ACCUNEB) 0.63 MG/3ML nebulizer solution Take 1 ampule by nebulization every 6 (six) hours as needed for wheezing.    [provider]  baclofen (LIORESAL) 10 MG tablet Take 1 tablet (10 mg total) by mouth 3 (three) times daily as needed for muscle spasms. 03/14/20   Coral Spikes, DO  Omega-3 Fatty Acids (FISH OIL) 1000 MG CAPS Take by mouth once.    [provider]  fexofenadine (ALLEGRA) 180 MG tablet Take 180 mg by mouth daily.  08/11/19  [provider]  Loratadine 10 MG CAPS Take 10 mg by mouth.   08/11/19  [provider]  omeprazole (PRILOSEC) 40 MG capsule Take 1 capsule (40 mg total) by mouth daily. 11/11/17 08/11/19  Jonathon Bellows, MD    Family History Family History  Problem Relation Age of Onset   Cancer Father        kidney    Social History Social History   Tobacco Use   Smoking status: Never   Smokeless tobacco: Never  Vaping Use   Vaping Use: Never used  Substance Use Topics   Alcohol use: No    Alcohol/week: 0.0 standard drinks   Drug use: No     Allergies   Ace inhibitors, Chlorthalidone, and Pravastatin   Review of Systems Review of Systems  Constitutional:  Negative for fatigue and fever.  HENT:  Positive for congestion.   Respiratory:  Positive for cough. Negative for shortness of breath.   Cardiovascular:  Negative for chest pain.  Genitourinary:  Negative for enuresis.  Musculoskeletal:  Positive for back pain.  Neurological:  Negative for weakness and numbness.    Physical Exam Triage Vital Signs ED Triage Vitals  Enc Vitals Group     BP 06/22/21 1251 134/77     Pulse Rate 06/22/21 1251 90     Resp 06/22/21 1251 16     Temp 06/22/21 1251 98.3 F (36.8 C)     Temp Source 06/22/21 1251 Oral     SpO2 06/22/21 1251 95 %     Weight 06/22/21 1246 198 lb (89.8 kg)     Height 06/22/21 1246 '5\' 7"'$  (1.702 m)     Head Circumference --      Peak Flow --      Pain Score 06/22/21 1246 7     Pain Loc --      Pain Edu? --      Excl. in [place]? --    No data found.  Updated Vital Signs BP 134/77 (BP Location: Left Arm)   Pulse 90   Temp 98.3 F (36.8 C) (Oral)   Resp 16   Ht '5\' 7"'$  (1.702 m)   Wt 198 lb (89.8 kg)   SpO2 95%   BMI 31.01 kg/m      Physical Exam Vitals and nursing note reviewed.  Constitutional:      General: He is not in acute distress.    Appearance: Normal appearance. He is well-developed. He is not ill-appearing.  HENT:     Head: Normocephalic and atraumatic.  Eyes:     General: No scleral icterus.     Conjunctiva/sclera: Conjunctivae normal.  Cardiovascular:     Rate and Rhythm: Normal rate and regular rhythm.     Heart sounds: No murmur heard. Pulmonary:     Effort: Pulmonary effort is normal. No respiratory distress.     Breath sounds: Wheezing (diffuse wheezing and rhonchi throughout) and rhonchi present.  Musculoskeletal:     Cervical back: Neck supple.     Lumbar back: Tenderness (TTP right paralumbar muscles, L4-L5, L5-S1) present. Decreased range of motion. Positive right straight leg raise test. Negative left straight leg raise test.  Skin:    General: Skin is warm and dry.  Neurological:     General: No focal deficit present.     Mental Status: He is alert. Mental status is at baseline.     Motor: No weakness.     Gait: Gait normal.  Psychiatric:        Mood and Affect: Mood normal.        Behavior: Behavior normal.        Thought Content: Thought content normal.     UC Treatments / Results  Labs (all labs ordered are listed, but only abnormal results are displayed) Labs Reviewed - No data to display  EKG   Radiology No results found.  Procedures Procedures (including critical care time)  Medications Ordered in UC Medications  dexamethasone (DECADRON) injection 10 mg (10 mg Intramuscular Given 06/22/21 1330)    Initial Impression / Assessment and Plan / UC Course  I have reviewed the triage vital signs and the nursing notes.  Pertinent labs & imaging results that were available during my care of the patient were reviewed by me and considered in my medical decision making (see chart for details).  70 year old male presenting with wife for acute flareup of chronic back pain over the past couple days.  He has had chronic back pain for the past 2 years and does see [facility] orthopedic clinic.  Patient has a pending referral  to physiatry placed for [facility] consideration for.  He is presently denying any red flag signs or symptoms.  Review of previous charts does  show that he has had dexamethasone and also Medrol or prednisone in the past.  He reports near total relief of pain after he takes corticosteroids.  He does request that today.  I did review progress notes from [facility] clinic.  I reviewed the MRI result. 06/17/21  See below.  Disc levels:   T12-L1: No significant spinal canal or neural foraminal narrowing.   L1-L2: Mild disc bulging. No significant spinal canal or neural  foraminal narrowing   L2-L3: Broad-based disc bulging, ligamentum flavum hypertrophy and  bilateral facet arthropathy results in moderate spinal canal  stenosis, mild left and mild right neural foraminal narrowing.   L3-L4: Broad-based disc bulging, ligamentum flavum hypertrophy and  bilateral facet arthropathy results in moderate spinal canal  stenosis. There is focal right paracentral disc extrusion with  subligamentous caudal migration. There is moderate right and mild  left neural foraminal narrowing.   L4-L5: Asymmetric left disc bulging, ligamentum flavum hypertrophy,  and bilateral facet arthropathy results in mild spinal canal  stenosis, mild left neural foraminal narrowing.   L5-S1: Broad-based disc bulging, ligamentum flavum hypertrophy and  bilateral facet arthropathy results in mild spinal canal narrowing,  mild right and moderate-severe left neural foraminal narrowing.   IMPRESSION:  Multilevel degenerative changes of the lumbar spine, with varying  degrees of spinal canal and neural foraminal narrowing as detailed  above and summarized below:   L2-L3: Moderate spinal canal stenosis. Mild bilateral neural  foraminal narrowing.   L3-L4: Moderate spinal canal stenosis. Moderate right and mild left  neural foraminal narrowing.   L4-L5: Mild spinal canal stenosis. Mild left neural foraminal  narrowing.   L5-S1: Mild spinal canal stenosis. Moderate-severe left and mild  right neural foraminal narrowing.    Electronically Signed     By: Maurine Simmering M.D.    On: 06/17/2021 11:06   Patient given 10 mg IM dexamethasone in clinic.  Prescribed Medrol Dosepak as that is what he had at orthopedics last.  I also gave him a short supply of hydrocodone after reviewing controlled substance database and finding him to be low risk for abuse.  Advised him that ESI could really help him especially since he is so responsive to the oral and IM corticosteroids.  Advised to continue to follow-up with orthopedics.  ED precautions for back pain reviewed with patient.  Also of note, on exam he had diffuse wheezing and rhonchi throughout chest.  Patient does have history of COPD and has admitted to a more productive cough than normal over the past week.  He has not had any fevers or increased breathing difficulty.  He does use his inhalers as he is supposed to.  He uses Breo Ellipta and albuterol as needed.  Treating patient at this time for COPD exacerbation.  I have sent azithromycin.  The Medrol may help as well.  Advise increase rest and fluids.  Follow-up as needed for any fever, worsening cough or increased pain difficulty.  To ED for any severe acute worsening of symptoms.   Final Clinical Impressions(s) / UC Diagnoses   Final diagnoses:  Chronic bilateral low back pain with right-sided sciatica  COPD exacerbation ([place])     Discharge Instructions      BACK PAIN: Stressed avoiding painful activities . RICE (REST, ICE, COMPRESSION, ELEVATION) guidelines reviewed. May alternate ice and heat. Consider use  of muscle rubs, Salonpas patches, etc. Use medications as directed including muscle relaxers if prescribed. Take anti-inflammatory medications as prescribed or OTC NSAIDs/Tylenol.  F/u with ortho in 7-10 days for reexamination, and please feel free to call or return to the urgent care at any time for any questions or concerns you may have and we will be happy to help you!   BACK PAIN RED FLAGS: If the back pain acutely worsens or there are  any red flag symptoms such as numbness/tingling, leg weakness, saddle anesthesia, or loss of bowel/bladder control, go immediately to the ER. Follow up with [place] as scheduled or sooner if the pain does not begin to resolve or if it worsens before the follow up       ED Prescriptions     Medication Sig Dispense Auth. Provider   methylPREDNISolone (MEDROL DOSEPAK) 4 MG TBPK tablet Take 6 tabs PO on day 1 and decrease by 1 tab daily until complete 21 tablet Laurene Footman B, PA-C   HYDROcodone-acetaminophen (NORCO/VICODIN) 5-325 MG tablet Take 1 tablet by mouth every 6 (six) hours as needed for up to 3 days. 12 tablet Laurene Footman B, PA-C   azithromycin (ZITHROMAX) 250 MG tablet Take 1 tablet (250 mg total) by mouth daily. Take first 2 tablets together, then 1 every day until finished. 6 tablet Danton Clap, PA-C      I have reviewed the PDMP during this encounter.   Danton Clap, PA-C 06/22/21 1347

## 2021-06-22 NOTE — ED Triage Notes (Signed)
Patient c/o lower right sided back pain that radiates down his right leg that started on Thursday night.  Patient states that he took Hydrocodone this morning.  Patient also took Tramadol this morning.

## 2021-06-22 NOTE — Discharge Instructions (Addendum)
BACK PAIN: Stressed avoiding painful activities . RICE (REST, ICE, COMPRESSION, ELEVATION) guidelines reviewed. May alternate ice and heat. Consider use of muscle rubs, Salonpas patches, etc. Use medications as directed including muscle relaxers if prescribed. Take anti-inflammatory medications as prescribed or OTC NSAIDs/Tylenol.  F/u with ortho in 7-10 days for reexamination, and please feel free to call or return to the urgent care at any time for any questions or concerns you may have and we will be happy to help you!   BACK PAIN RED FLAGS: If the back pain acutely worsens or there are any red flag symptoms such as numbness/tingling, leg weakness, saddle anesthesia, or loss of bowel/bladder control, go immediately to the ER. Follow up with [place] as scheduled or sooner if the pain does not begin to resolve or if it worsens before the follow up

## 2021-07-15 ENCOUNTER — Ambulatory Visit: Payer: Medicare Other | Admitting: Gastroenterology

## 2021-09-02 ENCOUNTER — Other Ambulatory Visit: Payer: Self-pay

## 2021-09-02 DIAGNOSIS — N281 Cyst of kidney, acquired: Secondary | ICD-10-CM

## 2021-09-02 DIAGNOSIS — N1330 Unspecified hydronephrosis: Secondary | ICD-10-CM

## 2021-09-09 ENCOUNTER — Ambulatory Visit
Admission: RE | Admit: 2021-09-09 | Discharge: 2021-09-09 | Disposition: A | Discharge status: Home / Self Care | Payer: Medicare Other | Source: Ambulatory Visit | Attending: Urology | Admitting: Urology

## 2021-09-09 ENCOUNTER — Other Ambulatory Visit: Payer: Self-pay

## 2021-09-09 DIAGNOSIS — N1330 Unspecified hydronephrosis: Secondary | ICD-10-CM | POA: Insufficient documentation

## 2021-09-09 DIAGNOSIS — N281 Cyst of kidney, acquired: Secondary | ICD-10-CM | POA: Diagnosis not present

## 2021-09-16 NOTE — Progress Notes (Signed)
09/17/21 1:18 PM   Ronnie Reyes [birth year] [id number]  Referring provider:  Gayland Curry, MD [address] [place],  [place] [address] Chief Complaint  Patient presents with   Hydronephrosis     HPI: Ronnie Reyes is a 70 y.o.male with a personal history of renal cyst, hydronephrosis, and left-sided sciatica, who presents today for 1 year follow-up with RUS.   Previously followed by Dr. Bernardo Heater at [facility] last seen in 05/2018 for the same issue.  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  A 2010 CT imaging showed a left parapelvic cyst (5 cm), re-demonstrated on a recent PET CT from 3/19. CT from 2010 also showed mild dilation of the left collecting system, secondary mass effect.  Upper tract parenchyma is normal.    This is now being followed serially with renal ultrasound and creatinine.   Most recent RUS on 09/09/2021 revealed a large left renal parapelvic cysts measuring up to approximately 10 cm with mild left hydronephrosis. Overall similar findings as the prior ultrasound.  He reports today that he has continued to have coccydynia pain and pain radiating down back. He is accompanied today by his wife. He denies any blood in his urine.   Cr 0.9  PMH: Past Medical History:  Diagnosis Date   Anemia    Asthma    Cancer ([place]) 2003   skin cancer/arm and back   Complication of anesthesia    HARD TIME WAKING UP AFTER  APPENDECTOMY   GERD (gastroesophageal reflux disease)    Hernia    Hypertension 2005   since 2005    Surgical History: Past Surgical History:  Procedure Laterality Date   ANAL FISTULOTOMY N/A 01/02/2016   Procedure: ANAL FISTULOTOMY;  Surgeon: Robert Bellow, MD;  Location: [facility] ORS;  Service: General;  Laterality: N/A;   APPENDECTOMY  2000   CHOLECYSTECTOMY  2006   COLONOSCOPY  [id number]   small serrated adenoma removed from the rectum at 15cm. This was his first screening colonoscopy   COLONOSCOPY  WITH PROPOFOL N/A 01/02/2016   Procedure: COLONOSCOPY WITH PROPOFOL;  Surgeon: Robert Bellow, MD;  Location: [facility] ENDOSCOPY;  Service: Endoscopy;  Laterality: N/A;   COLONOSCOPY WITH PROPOFOL N/A 11/19/2017   Procedure: COLONOSCOPY WITH PROPOFOL;  Surgeon: Jonathon Bellows, MD;  Location: [facility] ENDOSCOPY;  Service: Gastroenterology;  Laterality: N/A;   ESOPHAGOGASTRODUODENOSCOPY (EGD) WITH PROPOFOL N/A 11/19/2017   Procedure: ESOPHAGOGASTRODUODENOSCOPY (EGD) WITH PROPOFOL;  Surgeon: Jonathon Bellows, MD;  Location: [facility] ENDOSCOPY;  Service: Gastroenterology;  Laterality: N/A;   ESOPHAGOGASTRODUODENOSCOPY (EGD) WITH PROPOFOL N/A 05/20/2018   Procedure: ESOPHAGOGASTRODUODENOSCOPY (EGD) WITH PROPOFOL;  Surgeon: Jonathon Bellows, MD;  Location: [facility] ENDOSCOPY;  Service: Gastroenterology;  Laterality: N/A;   ESOPHAGOGASTRODUODENOSCOPY (EGD) WITH PROPOFOL N/A 04/08/2021   Procedure: ESOPHAGOGASTRODUODENOSCOPY (EGD) WITH PROPOFOL;  Surgeon: Jonathon Bellows, MD;  Location: [facility] ENDOSCOPY;  Service: Gastroenterology;  Laterality: N/A;   GIVENS CAPSULE STUDY N/A 05/20/2018   Procedure: GIVENS CAPSULE STUDY x 12 HR;  Surgeon: Jonathon Bellows, MD;  Location: [facility] ENDOSCOPY;  Service: Gastroenterology;  Laterality: N/A;   ingrown toenail removal  1964   NOSE SURGERY  1974    Home Medications:  Allergies as of 09/17/2021       Reactions   Ace Inhibitors Shortness Of Breath   Chlorthalidone Anaphylaxis   Pravastatin Rash   Can take lovastatin 20        Medication List  Accurate as of September 17, 2021  1:18 PM. If you have any questions, ask your nurse or doctor.          STOP taking these medications    albuterol 0.63 MG/3ML nebulizer solution Commonly known as: ACCUNEB Stopped by: Hollice Espy, MD   azithromycin 250 MG tablet Commonly known as: ZITHROMAX Stopped by: Hollice Espy, MD   baclofen 10 MG tablet Commonly known as: LIORESAL Stopped by: Hollice Espy, MD   Fish Oil 1000 MG  Caps Stopped by: Hollice Espy, MD   methylPREDNISolone 4 MG Tbpk tablet Commonly known as: MEDROL DOSEPAK Stopped by: Hollice Espy, MD       TAKE these medications    acetaminophen 500 MG tablet Commonly known as: TYLENOL Take 1,250 mg by mouth.   amLODipine 10 MG tablet Commonly known as: NORVASC Take 10 mg by mouth every morning.   atorvastatin 20 MG tablet Commonly known as: LIPITOR Take by mouth.   CENTRUM SILVER PO Take by mouth once.   fluticasone 50 MCG/ACT nasal spray Commonly known as: FLONASE Place 1 spray into both nostrils daily.   fluticasone furoate-vilanterol 100-25 MCG/INH Aepb Commonly known as: BREO ELLIPTA Inhale 1 puff into the lungs daily.   Glucosamine Chondroitin Joint Tabs Take by mouth.   niacin 500 MG tablet Take 500 mg by mouth at bedtime.   vitamin B-12 1000 MCG tablet Commonly known as: CYANOCOBALAMIN Take 1 tablet (1,000 mcg total) by mouth daily.        Allergies:  Allergies  Allergen Reactions   Ace Inhibitors Shortness Of Breath   Chlorthalidone Anaphylaxis   Pravastatin Rash    Can take lovastatin 20    Family History: Family History  Problem Relation Age of Onset   Cancer Father        kidney    Social History:  reports that he has never smoked. He has never used smokeless tobacco. He reports that he does not drink alcohol and does not use drugs.   Physical Exam: BP (!) 150/85   Pulse 89   Ht 5\' 7"  (1.702 m)   Wt 198 lb (89.8 kg)   BMI 31.01 kg/m   Constitutional:  Alert and oriented, No acute distress. HEENT: [place] AT, moist mucus membranes.  Trachea midline, no masses. Cardiovascular: No clubbing, cyanosis, or edema. Respiratory: Normal respiratory effort, no increased work of breathing. Skin: No rashes, bruises or suspicious lesions. Neurologic: Grossly intact, no focal deficits, moving all 4 extremities. Psychiatric: Normal mood and affect.  Laboratory Data:  Lab Results  Component Value Date    CREATININE 0.82 03/24/2018    Pertinent Imaging: CLINICAL DATA:  Renal cyst.   EXAM: RENAL / URINARY TRACT ULTRASOUND COMPLETE   COMPARISON:  Ultrasound dated 09/09/2019 and CT dated 06/21/2009.   FINDINGS: Right Kidney:   Renal measurements: 11.8 x 5.3 x 5.6 cm = volume: 182 mL. Normal echogenicity. Mild fullness of the renal collecting system. No shadowing stone.   Left Kidney:   Renal measurements: 14.6 x 7.3 x 5.2 cm = volume: 290 mL. There is a large parapelvic cyst measuring up to approximately 10 cm. There is mild left hydronephrosis.   Bladder:   The urinary bladder is grossly unremarkable for the degree of distension. Bilateral ureteral jets noted.   Other:   There is diffuse increased liver echogenicity most commonly seen in the setting of fatty infiltration. Superimposed inflammation or fibrosis is not excluded. Clinical correlation is recommended.   IMPRESSION: Large left renal  parapelvic cysts with mild left hydronephrosis. Overall similar findings as the prior ultrasound.     Electronically Signed   By: Anner Crete M.D.   On: 09/10/2021 02:00   Renal ultrasound was personally reviewed today and compared to previous renal ultrasound.  Essentially stable without evidence of parenchymal loss.  There is bilateral E flux of urine/ureteral jet appreciated.  Assessment & Plan:    Left upperpole hydronephrosis  - Mild and stable without parenchymal atrophy  - will continue observation due to lack of interval change  - His renal function is stable which is reassuring -Unlikely contributing to his back pain which is primarily sciatic and coccygeal in nature  Return in 2 year with RUS  I,Kailey Littlejohn,acting as a scribe for Hollice Espy, MD.,have documented all relevant documentation on the behalf of Hollice Espy, MD,as directed by  Hollice Espy, MD while in the presence of Hollice Espy, MD.  I have reviewed the above documentation for  accuracy and completeness, and I agree with the above.   Hollice Espy, MD   [facility] [address] [place], [place] [address] [phone]

## 2021-09-17 ENCOUNTER — Other Ambulatory Visit: Payer: Self-pay

## 2021-09-17 ENCOUNTER — Ambulatory Visit: Payer: Medicare Other | Admitting: Urology

## 2021-09-17 ENCOUNTER — Encounter: Payer: Self-pay | Admitting: Urology

## 2021-09-17 VITALS — BP 150/85 | HR 89 | Ht 67.0 in | Wt 198.0 lb

## 2021-09-17 DIAGNOSIS — N1330 Unspecified hydronephrosis: Secondary | ICD-10-CM | POA: Diagnosis not present

## 2021-09-17 DIAGNOSIS — N281 Cyst of kidney, acquired: Secondary | ICD-10-CM

## 2021-12-24 DIAGNOSIS — K2940 Chronic atrophic gastritis without bleeding: Secondary | ICD-10-CM | POA: Insufficient documentation

## 2021-12-24 HISTORY — DX: Chronic atrophic gastritis without bleeding: K29.40

## 2022-01-06 DIAGNOSIS — M48061 Spinal stenosis, lumbar region without neurogenic claudication: Secondary | ICD-10-CM | POA: Insufficient documentation

## 2022-01-06 HISTORY — DX: Spinal stenosis, lumbar region without neurogenic claudication: M48.061

## 2022-03-05 ENCOUNTER — Other Ambulatory Visit: Payer: Self-pay | Admitting: Physical Medicine & Rehabilitation

## 2022-03-05 DIAGNOSIS — M542 Cervicalgia: Secondary | ICD-10-CM

## 2022-03-07 ENCOUNTER — Ambulatory Visit
Admission: RE | Admit: 2022-03-07 | Discharge: 2022-03-07 | Disposition: A | Discharge status: Home / Self Care | Payer: Medicare Other | Source: Ambulatory Visit | Attending: Physical Medicine & Rehabilitation | Admitting: Physical Medicine & Rehabilitation

## 2022-03-07 DIAGNOSIS — M542 Cervicalgia: Secondary | ICD-10-CM

## 2022-08-04 ENCOUNTER — Ambulatory Visit: Payer: Medicare Other | Admitting: Gastroenterology

## 2022-08-04 ENCOUNTER — Encounter: Payer: Self-pay | Admitting: Gastroenterology

## 2022-08-04 VITALS — BP 118/80 | HR 88 | Ht 67.0 in | Wt 200.1 lb

## 2022-08-04 DIAGNOSIS — Z86010 Personal history of colonic polyps: Secondary | ICD-10-CM | POA: Diagnosis not present

## 2022-08-04 DIAGNOSIS — Z791 Long term (current) use of non-steroidal anti-inflammatories (NSAID): Secondary | ICD-10-CM

## 2022-08-04 DIAGNOSIS — D3A8 Other benign neuroendocrine tumors: Secondary | ICD-10-CM

## 2022-08-04 NOTE — Progress Notes (Signed)
Ronnie Bellows MD, MRCP(U.K) [address]  [place]  [place], [place] [address]  Main: [phone]  Fax: [phone]   Primary Care Physician: Gayland Curry, MD  Primary Gastroenterologist:  Dr. Jonathon Reyes   Chief Complaint  Patient presents with   Follow-up    HPI: Ronnie Reyes is a 71 y.o. 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  He was following with Dr. Tasia Catchings in hematology.  H. pylori stool antigen and celiac serology were negative.   11/19/17- EGD: A few gastric sessile polyps were seen at the antrum and biopsies suggested intestinal metaplasia and chronic gastritis, atrophic gastritis-negative for H pylori . A 8 mm polyp was also seen close by - I only took sample biopsies from the edge WELL-DIFFERENTIATED NEUROENDOCRINE TUMOR, 2 MM. - LYMPHOVASCULAR INVASION. - TUMOR EXTENDS TO THE DEEP BIOPSY BASE. G1, Well-differentiated neuroendocrine tumor Mitotic Rate: 1 mitoses / 2 mm2 ,Ki-67 Labeling Index: <1 % ,Tumor Extension: At least into the lamina propria ,Margins:  Tumor extends to the deep biopsy base      Colonoscopy- 2 tubular adenomas were excised.  10/2017 - Capsule study - poor prep and did not exit out 11/2017 Underwent EUS and EMR of gastric polyp- well differentiated NET 11/27/17- Gastrin significantly elevated at 1364  Repeat capsule study in 2019 showed some mucosal ulcerations and inflammation and was referred to [facility] for opinion.   Subsequently seen and evaluated at [facility] by Dr. Heide Guile and double-balloon enteroscopy performed November 2019, difficult procedure due to looping tattoo was placed at the distal point it was reached retrograde enteroscopy performed in December 2019 sites of surface ulceration and found biopsies just showed ulceration and reactive changes.  It was felt that previously seen issues  were had resolved.    February 2020: CT enterography at [facility] showed abnormal appearance of left kidney with hydronephrosis sigmoid diverticulosis enlarged prostate.  He also had a capsule study on 12/14/2018 and was given a course of budesonide for 2 ilial ulcer seen.    Interval history 03/13/2021 -08/04/2022  04/08/2021: EGD features of gastritis seen.  Gastric mapping performed features of intestinal metaplasia, chronic atrophic gastritis, inactive gastritis with seen.  Recommended repeat EGD in 3 years 07/11/2022 CMP normal, B12 normal, iron studies normal.  March 2023 hemoglobin 16.8 g. He called and made an appointment for abdominal pain and change in bowel habits which is ongoing for a few weeks and resolved.  Presently has no GI issues.  Not on any NSAIDs presently doing well otherwise.    Current Outpatient Medications  Medication Sig Dispense Refill   acetaminophen (TYLENOL) 500 MG tablet Take 1,250 mg by mouth.     amLODipine (NORVASC) 10 MG tablet Take 10 mg by mouth every morning.   0   fluticasone (FLONASE) 50 MCG/ACT nasal spray Place 1 spray into both nostrils daily.   3   fluticasone furoate-vilanterol (BREO ELLIPTA) 100-25 MCG/INH AEPB Inhale 1 puff into the lungs daily.     Glucos-Chondroit-Hyaluron-MSM (GLUCOSAMINE CHONDROITIN JOINT) TABS Take by mouth.     Multiple Vitamins-Minerals (CENTRUM SILVER PO) Take by mouth once.     niacin 500 MG tablet Take 500 mg by mouth at bedtime.     vitamin B-12 (CYANOCOBALAMIN) 1000 MCG tablet Take 1 tablet (1,000 mcg total) by mouth  daily. 90 tablet 0   atorvastatin (LIPITOR) 20 MG tablet Take by mouth.     ferrous sulfate 324 (65 Fe) MG TBEC Take by mouth.     No current facility-administered medications for this visit.    Allergies as of 08/04/2022 - Review Complete 08/04/2022  Allergen Reaction Noted   Ace inhibitors Shortness Of Breath 08/28/2015   Chlorthalidone Anaphylaxis 08/28/2015   Pravastatin Rash 04/02/2017     ROS:  General: Negative for anorexia, weight loss, fever, chills, fatigue, weakness. ENT: Negative for hoarseness, difficulty swallowing , nasal congestion. CV: Negative for chest pain, angina, palpitations, dyspnea on exertion, peripheral edema.  Respiratory: Negative for dyspnea at rest, dyspnea on exertion, cough, sputum, wheezing.  GI: See history of present illness. GU:  Negative for dysuria, hematuria, urinary incontinence, urinary frequency, nocturnal urination.  Endo: Negative for unusual weight change.    Physical Examination:   BP (!) 148/90   Pulse 88   Ht 5' 7" (1.702 m)   Wt 200 lb 2 oz (90.8 kg)   BMI 31.34 kg/m   General: Well-nourished, well-developed in no acute distress.  Eyes: No icterus. Conjunctivae pink. Neuro: Alert and oriented x 3.  Grossly intact. Skin: Warm and dry, no jaundice.   Psych: Alert and cooperative, normal mood and affect.   Imaging Studies: No results found.  Assessment and Plan:   Ronnie Reyes is a 71 y.o. 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 Likely atrophic gastritis cause for impaired iron absorption. 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 Elevated Gastrin, NET is Type 1 associated with atrophic gastritis and since <1 cm usually has good prognosis . S/p EMR and resected of tumor at [facility] in 11/2017 .underwent double-balloon enteroscopy at [facility] and ulceration seen on capsule study was found to be benign.  CT enterography did not show any obstruction, strictures and he had a repeat capsule that showed few ulcers in the terminal ileum.  At that time he was given a  course of budesonide and was planned for reassessment subsequently but did not follow-up.     He came in today to see me for abdominal discomfort that he had a few weeks back but has spontaneously resolved no active issues.  Plan : 1.  Repeat EGD for Gastric mapping in view of intestinal metaplasia as well as evaluation of EMR site where NET resected.  2.  Long overdue to have his chromogranin A and 5-HIAA levels checked 3.  I will forward the note to Dr. Tasia Catchings to determine if she would need to follow him 4. Colonoscopy in January 2024 for polyp surveillance  5.  Gastric intestinal metaplasia: EGD to be performed in 2025 for gastric mapping     Dr Ronnie Bellows  MD,MRCP [facility]) Follow up in as needed

## 2022-08-04 NOTE — Addendum Note (Signed)
Addended by: Vanetta Mulders on: 08/04/2022 03:53 PM   Modules accepted: Orders

## 2022-08-05 LAB — CHROMOGRANIN A: Chromogranin A (ng/mL): 112.5 ng/mL — ABNORMAL HIGH (ref 0.0–101.8)

## 2022-08-06 NOTE — Progress Notes (Signed)
FYI Dr Tasia Catchings

## 2022-08-08 LAB — 5 HIAA, QUANTITATIVE, URINE, 24 HOUR
5-HIAA, Ur: 3.2 mg/L
5-HIAA,Quant.,24 Hr Urine: 4.8 mg/24 hr (ref 0.0–14.9)

## 2022-09-29 NOTE — Progress Notes (Signed)
Ronnie Reyes can we make sure he has a follow up with Dr Tasia Catchings for his neuroendocrine tumor and elevated chromogranin levels please

## 2022-09-30 ENCOUNTER — Telehealth: Payer: Self-pay

## 2022-09-30 DIAGNOSIS — D3A8 Other benign neuroendocrine tumors: Secondary | ICD-10-CM

## 2022-09-30 NOTE — Telephone Encounter (Signed)
-----   Message from Jonathon Bellows, MD sent at 09/29/2022 10:47 AM EST ----- Herb Grays can we make sure he has a follow up with Dr Tasia Catchings for his neuroendocrine tumor and elevated chromogranin levels please

## 2022-10-01 ENCOUNTER — Telehealth: Payer: Self-pay | Admitting: Gastroenterology

## 2022-10-01 NOTE — Telephone Encounter (Signed)
Pt called in ref to question about ref. Would like a call back

## 2022-10-02 NOTE — Telephone Encounter (Signed)
Please read message from 10/01/2022.

## 2022-10-02 NOTE — Telephone Encounter (Signed)
Patient called back and stated that he would be calling Dr. Collie Siad office to schedule an appointment since they had called him already. Patient had no further questions.

## 2022-10-07 ENCOUNTER — Inpatient Hospital Stay: Payer: Medicare Other | Attending: Oncology | Admitting: Oncology

## 2022-10-07 ENCOUNTER — Encounter: Payer: Self-pay | Admitting: Oncology

## 2022-10-07 ENCOUNTER — Inpatient Hospital Stay: Payer: Medicare Other

## 2022-10-07 VITALS — BP 146/89 | HR 74 | Temp 97.7°F | Wt 199.7 lb

## 2022-10-07 DIAGNOSIS — C7A092 Malignant carcinoid tumor of the stomach: Secondary | ICD-10-CM

## 2022-10-07 DIAGNOSIS — D508 Other iron deficiency anemias: Secondary | ICD-10-CM

## 2022-10-07 DIAGNOSIS — D3A8 Other benign neuroendocrine tumors: Secondary | ICD-10-CM

## 2022-10-07 DIAGNOSIS — D509 Iron deficiency anemia, unspecified: Secondary | ICD-10-CM | POA: Diagnosis not present

## 2022-10-07 DIAGNOSIS — C7A8 Other malignant neuroendocrine tumors: Secondary | ICD-10-CM

## 2022-10-07 DIAGNOSIS — Z7189 Other specified counseling: Secondary | ICD-10-CM

## 2022-10-07 NOTE — Progress Notes (Signed)
New patient referred by Dr. Vicente Males.

## 2022-10-08 ENCOUNTER — Ambulatory Visit
Admission: RE | Admit: 2022-10-08 | Discharge: 2022-10-08 | Disposition: A | Discharge status: Home / Self Care | Payer: Medicare Other | Source: Ambulatory Visit | Attending: Oncology | Admitting: Oncology

## 2022-10-08 DIAGNOSIS — D3A8 Other benign neuroendocrine tumors: Secondary | ICD-10-CM

## 2022-10-08 LAB — POCT I-STAT CREATININE: Creatinine, Ser: 0.8 mg/dL (ref 0.61–1.24)

## 2022-10-08 MED ORDER — IOHEXOL 300 MG/ML  SOLN
100.0000 mL | Freq: Once | INTRAMUSCULAR | Status: AC | PRN
  Administered 2022-10-08: 100 mL via INTRAVENOUS

## 2022-10-10 ENCOUNTER — Telehealth: Payer: Self-pay

## 2022-10-10 NOTE — Telephone Encounter (Signed)
Dr. Tasia Catchings please advise on follow up. Pt had CT scan on 12/20.  She is scheduled for colonoscopy on 10/28/21

## 2022-10-10 NOTE — Telephone Encounter (Signed)
-----   Message from Wayna Chalet, [place] sent at 10/10/2022 12:29 PM EST ----- Benjamine Mola, can you please make sure he follows up with Dr. Tasia Catchings. Thank you so much!  Merry Christmas and a Happy New Year! ----- Message ----- From: Jonathon Bellows, MD Sent: 09/29/2022  10:47 AM EST To: Wayna Chalet, CMA  [place] can we make sure he has a follow up with Dr Tasia Catchings for his neuroendocrine tumor and elevated chromogranin levels please

## 2022-10-14 ENCOUNTER — Encounter: Payer: Self-pay | Admitting: Oncology

## 2022-10-14 DIAGNOSIS — Z7189 Other specified counseling: Secondary | ICD-10-CM | POA: Insufficient documentation

## 2022-10-14 NOTE — Progress Notes (Signed)
Hematology/Oncology Consult Note [phone] [phone]     REFERRING PROVIDER: Gayland Curry, MD   Patient Care Team: Gayland Curry, MD as PCP - General (Family Medicine) Bary Castilla, Forest Gleason, MD (General Surgery) Clent Jacks, RN as Oncology Nurse Navigator  CHIEF COMPLAINTS/PURPOSE OF CONSULTATION:  gastric neuroendocrine carcinoma   HISTORY OF PRESENTING ILLNESS:  Ronnie Reyes 71 y.o. male presents to establish care for history of gastric neuroendocrine carcinoma.  I have reviewed his chart and materials related to his cancer extensively and collaborated history with the patient. 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 Biopsied.- A single gastric polyp. Biopsied. 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 Large inflamed polyp revealed well differentiated neuroendocrine tumor, 58m, +LVI, tumor extends to the deep biopsy base. pT1pNx  Histologic Type and Grade: G1, Well-differentiated neuroendocrine tumor  Mitotic Rate: 1 mitoses / 2 mm2  Ki-67 Labeling Index: <1 %    11/19/2017 Cancer Staging   Staging form: Stomach - Neuroendocrine Tumors, AJCC 8th Edition - Pathologic stage from 11/19/2017: Stage Unknown (pT1, pNX, cM0) - Signed by YEarlie Server MD on 10/14/2022 Stage prefix: Initial diagnosis Ki-67 (%): 1 Histologic grade (G): G1 Histologic grading system: 3 grade system   11/19/2017 Procedure   Colonoscopy - 2 tubular adenomas were excised    12/11/2017 Procedure   He has had submural resection of gastric neuroendocrine carcinoma [facility] Pathology showed Well-differentiated neuroendocrine tumor  Ki-67 Labeling Index:    <  3%  Tumor Size:    Greatest dimension in Centimeters (cm): 0.9 cm Tumor Focality:    Unifocal  Tumor Extent:     Tumor Extension:    Tumor invades the lamina propria   Accessory Findings:      Lymphovascular Invasion:    Not identified   Perineural Invasion:    Not identified    Margins:        Deep Margin:    Uninvolved by tumor     Mucosal Margin:    Uninvolved by tumor      05/20/2018 Procedure   Upper EGD   06/02/2018 Procedure   Capsule study showed area of mucosal inflammation and a possible ulceration in few locations.  Possible AVM with some bleeding.  Capsule did not exit the small bowel despite using 13-hour study.  It did exit the stomach within 90 minutes.  Prolonged small bowel transit.   [facility] seen in the setting of iron deficient anemia and NET type I of stomach.  May require balloon enteroscopy to evaluate further.   A repeat capsule study showed some mucosal ulcerations and inflammation [facility].     08/2018 Procedure   [facility] performed November 2019, difficult procedure due to looping, tattoo was placed at the distal point it was reached  retrograde enteroscopy performed in December 2019 sites of surface ulceration and found biopsies just showed ulceration and reactive changes.  It was felt that previously seen issues were had resolved.      12/09/2018 Procedure   Lower double balloon assisted enteroscopy Showed normal colon. Ileitis He was treated with course of steroid.  04/08/2021 Procedure   EGD features of gastritis seen. Gastric mapping performed features of intestinal metaplasia, chronic atrophic gastritis, inactive gastritis with seen. Recommended repeat EGD in 3 years      Patient lost follow-up with me.  Patient was seen by me last on 10/27/2018.  He recently called gastroenterology for evaluation of abdominal  pain.  He was seen by Dr. Vicente Males on 08/04/2022.  Plan to repeat endoscopy in January 2024.  He was referred back to reestablish care with me. 08/06/2022, 5HIAA level 3.2,  chromogranin A level 112 Patient takes Prilosec 40 mg daily. Patient denies any NSAIDs use.  Abdominal pain has resolved.   MEDICAL HISTORY:  Past Medical History:  Diagnosis Date   Anemia    Asthma    Cancer ([place]) 2003   skin cancer/arm and back   Complication of anesthesia    HARD TIME WAKING UP AFTER  APPENDECTOMY   GERD (gastroesophageal reflux disease)    Hernia    Hypertension 2005   since 2005    SURGICAL HISTORY: Past Surgical History:  Procedure Laterality Date   ANAL FISTULOTOMY N/A 01/02/2016   Procedure: ANAL FISTULOTOMY;  Surgeon: Robert Bellow, MD;  Location: [facility] ORS;  Service: General;  Laterality: N/A;   APPENDECTOMY  2000   CHOLECYSTECTOMY  2006   COLONOSCOPY  [id number]   small serrated adenoma removed from the rectum at 15cm. This was his first screening colonoscopy   COLONOSCOPY WITH PROPOFOL N/A 01/02/2016   Procedure: COLONOSCOPY WITH PROPOFOL;  Surgeon: Robert Bellow, MD;  Location[facility] ENDOSCOPY;  Service: Endoscopy;  Laterality: N/A;   COLONOSCOPY WITH PROPOFOL N/A 11/19/2017   Procedure: COLONOSCOPY WITH PROPOFOL;  Surgeon: Jonathon Bellows, MD;  Location: [facility] ENDOSCOPY;  Service: Gastroenterology;  Laterality: N/A;   ESOPHAGOGASTRODUODENOSCOPY (EGD) WITH PROPOFOL N/A 11/19/2017   Procedure: ESOPHAGOGASTRODUODENOSCOPY (EGD) WITH PROPOFOL;  Surgeon: Jonathon Bellows, MD;  Location: [facility] ENDOSCOPY;  Service: Gastroenterology;  Laterality: N/A;   ESOPHAGOGASTRODUODENOSCOPY (EGD) WITH PROPOFOL N/A 05/20/2018   Procedure: ESOPHAGOGASTRODUODENOSCOPY (EGD) WITH PROPOFOL;  Surgeon: Jonathon Bellows, MD;  Location: [facility] ENDOSCOPY;  Service: Gastroenterology;  Laterality: N/A;   ESOPHAGOGASTRODUODENOSCOPY (EGD) WITH PROPOFOL N/A 04/08/2021   Procedure: ESOPHAGOGASTRODUODENOSCOPY (EGD) WITH PROPOFOL;   Surgeon: Jonathon Bellows, MD;  Location: [facility] ENDOSCOPY;  Service: Gastroenterology;  Laterality: N/A;   GIVENS CAPSULE STUDY N/A 05/20/2018   Procedure: GIVENS CAPSULE STUDY x 12 HR;  Surgeon: Jonathon Bellows, MD;  Location: [facility] ENDOSCOPY;  Service: Gastroenterology;  Laterality: N/A;   ingrown toenail removal  1964   NOSE SURGERY  1974    SOCIAL HISTORY: Social History   Socioeconomic History   Marital status: Married    Spouse name: Not on file   Number of children: Not on file   Years of education: Not on file   Highest education level: Not on file  Occupational History   Not on file  Tobacco Use   Smoking status: Never   Smokeless tobacco: Never  Vaping Use   Vaping Use: Never used  Substance and Sexual Activity   Alcohol use: No    Alcohol/week: 0.0 standard drinks of alcohol   Drug use: No   Sexual activity: Yes  Other Topics Concern   Not on file  Social History Narrative   Not on file   Social Determinants of Health   Financial Resource Strain: Not on file  Food Insecurity: Not on file  Transportation Needs: Not on file  Physical Activity: Not on file  Stress: Not on file  Social Connections: Not on file  Intimate Partner Violence: Not on file    FAMILY HISTORY: Family History  Problem Relation Age of Onset   Cancer Father        kidney    ALLERGIES:  is allergic to ace inhibitors, chlorthalidone, and pravastatin.  MEDICATIONS:  Current Outpatient Medications  Medication Sig Dispense Refill   acetaminophen (TYLENOL) 500 MG tablet Take 1,250 mg by mouth.     amLODipine (NORVASC) 10 MG tablet Take 10 mg by mouth every morning.   0   atorvastatin (LIPITOR) 20 MG tablet Take by mouth.     ferrous sulfate 324 (65 Fe) MG TBEC Take by mouth.     fluticasone (FLONASE) 50 MCG/ACT nasal spray Place 1 spray into both nostrils daily.   3   fluticasone furoate-vilanterol (BREO ELLIPTA) 100-25 MCG/INH AEPB Inhale 1 puff into the lungs daily.      Glucos-Chondroit-Hyaluron-MSM (GLUCOSAMINE CHONDROITIN JOINT) TABS Take by mouth.     Misc Natural Products (YUMVS BEET ROOT-TART CHERRY PO) Take by mouth.     Multiple Vitamins-Minerals (CENTRUM SILVER PO) Take by mouth once.     niacin 500 MG tablet Take 500 mg by mouth at bedtime.     vitamin B-12 (CYANOCOBALAMIN) 1000 MCG tablet Take 1 tablet (1,000 mcg total) by mouth daily. 90 tablet 0   No current facility-administered medications for this visit.    Review of Systems - Oncology   PHYSICAL EXAMINATION: ECOG PERFORMANCE STATUS: 0 - Asymptomatic  Vitals:   10/07/22 0923  BP: (!) 146/89  Pulse: 74  Temp: 97.7 F (36.5 C)  SpO2: 98%   Filed Weights   10/07/22 0923  Weight: 199 lb 11.2 oz (90.6 kg)    Physical Exam   LABORATORY DATA:  I have reviewed the data as listed    Latest Ref Rng & Units 10/25/2018   11:04 AM 07/26/2018    1:24 PM 04/21/2018    2:30 PM  CBC  WBC 4.0 - 10.5 K/uL 7.1  8.2  7.8   Hemoglobin 13.0 - 17.0 g/dL 15.5  16.2  14.9   Hematocrit 39.0 - 52.0 % 46.6  46.2  44.4   Platelets 150 - 400 K/uL 241  220  261       Latest Ref Rng & Units 10/08/2022    8:32 AM 03/13/2021   10:38 AM 03/24/2018    2:06 PM  CMP  Glucose 65 - 99 mg/dL   98   BUN 6 - 20 mg/dL   13   Creatinine 0.61 - 1.24 mg/dL 0.80   0.82   Sodium 135 - 145 mmol/L   142   Potassium 3.5 - 5.1 mmol/L   3.9   Chloride 101 - 111 mmol/L   105   CO2 22 - 32 mmol/L   27   Calcium 8.9 - 10.3 mg/dL   9.2   Total Protein 6.5 - 8.1 g/dL   7.6   Total Bilirubin 0.0 - 1.2 mg/dL  0.8  0.6   Alkaline Phos 38 - 126 U/L   54   AST 15 - 41 U/L   25   ALT 17 - 63 U/L   19      RADIOGRAPHIC STUDIES: I have personally reviewed the radiological images as listed and agreed with the findings in the report. CT CHEST ABDOMEN PELVIS W CONTRAST  Result Date: 10/10/2022 CLINICAL DATA:  There is CT impression * Tracking Code: BO * EXAM: CT CHEST, ABDOMEN,  AND PELVIS WITH CONTRAST TECHNIQUE:  Multidetector CT imaging of the chest, abdomen and pelvis was performed following the standard protocol during bolus administration of intravenous contrast. RADIATION DOSE REDUCTION: This exam was performed according to the departmental dose-optimization program which includes automated exposure control, adjustment of the mA and/or kV according to patient size and/or use of iterative reconstruction technique. CONTRAST:  148m OMNIPAQUE IOHEXOL 300 MG/ML  SOLN COMPARISON:  DOTATATE PET scan 01/04/2018., CT chest 04/30/2018 FINDINGS: CT CHEST FINDINGS Cardiovascular: Ascending thoracic aorta measures 40 mm (image 27/2) Coronary artery calcification and aortic atherosclerotic calcification. Mediastinum/Nodes: No axillary or supraclavicular adenopathy. No mediastinal or hilar adenopathy. No pericardial fluid. Esophagus normal. Lungs/Pleura: Previous described RIGHT lower lobe pulmonary nodule has resolved. Subpleural nodule in the upper lobe measuring 4 mm (55/3) is unchanged from diagnostic CT 04/30/2018. Musculoskeletal: No aggressive osseous lesion. CT ABDOMEN AND PELVIS FINDINGS Hepatobiliary: No focal hepatic lesion. Postcholecystectomy. No biliary dilatation. Pancreas: Pancreas is normal. No ductal dilatation. No pancreatic inflammation. Spleen: Normal spleen Adrenals/urinary tract: Adrenal glands normal. Large irregular benign cyst of the LEFT kidney measures 11 cm not changed from 2019. ureters and bladder normal. Stomach/Bowel: A surgical clip in the mid body of the stomach. No gastric mass. No gastric obstruction. Stomach Small-bowel normal. Multiple diverticula of the descending colon and sigmoid colon without acute inflammation. No mesenteric nodularity. Vascular/Lymphatic: Abdominal aorta is normal caliber. There is no retroperitoneal or periportal lymphadenopathy. No pelvic lymphadenopathy. Reproductive: Prostate unremarkable Other: No free fluid. Musculoskeletal: No aggressive osseous lesion. IMPRESSION:  Chest Impression: 1. No evidence of thoracic metastasis. 2. Resolution RIGHT lobe pulmonary nodule. Abdomen / Pelvis Impression: 1. No evidence neuroendocrine tumor recurrence within the stomach. 2. No evidence of metastatic adenopathy or mesenteric nodularity. 3. Normal bowel. 4. No evidence of liver metastasis liver metastasis 5. Large benign LEFT renal cyst again noted. No specific follow-up recommended. Electronically Signed   By: SSuzy BouchardM.D.   On: 10/10/2022 16:06    ASSESSMENT & PLAN:   Primary neuroendocrine carcinoma of body of stomach [place] History of well-differentiated neuroendocrine carcinoma of stomach, status post submucosal resection with negative margin. I recommend patient to hold off his PPI and repeat chromogranin A level 2 weeks later Check CT chest abdomen pelvis  Iron deficiency anemia History of iron deficiency anemia.  Repeat CBC, CMP, iron TIBC ferritin   Orders Placed This Encounter  Procedures   CT CHEST ABDOMEN PELVIS W CONTRAST    ASAP - pt [facility]    Standing Status:   Future    Number of Occurrences:   1    Standing Expiration Date:   10/08/2023    Order Specific Question:   If indicated for the ordered procedure, I authorize the administration of contrast media per Radiology protocol    Answer:   Yes    Order Specific Question:   Does the patient have a contrast media/X-ray dye allergy?    Answer:   Yes    Order Specific Question:   Preferred imaging location?    Answer:   [facility]   Order Specific Question:   Is Oral Contrast requested for this exam?    Answer:   Yes, Per Radiology protocol   CBC with Differential/Platelet    Standing Status:   Future    Standing Expiration Date:   10/08/2023   Comprehensive metabolic panel    Standing Status:   Future    Standing Expiration Date:   10/07/2023   Chromogranin A  Standing Status:   Future    Standing Expiration Date:   10/08/2023   Follow-up to be  determined pending CT scan and blood work.   All questions were answered. The patient knows to call the clinic with any problems, questions or concerns. No barriers to learning was detected.  Earlie Server, MD 10/07/2022

## 2022-10-14 NOTE — Telephone Encounter (Signed)
FYI- Dr. Tasia Catchings waiting for Lab results and then will set follow up.

## 2022-10-14 NOTE — Assessment & Plan Note (Signed)
History of well-differentiated neuroendocrine carcinoma of stomach, status post submucosal resection with negative margin. I recommend patient to hold off his PPI and repeat chromogranin A level 2 weeks later Check CT chest abdomen pelvis

## 2022-10-14 NOTE — Assessment & Plan Note (Signed)
History of iron deficiency anemia.  Repeat CBC, CMP, iron TIBC ferritin

## 2022-10-16 NOTE — Telephone Encounter (Signed)
Patient has an appointment with Dr. Tasia Catchings on 10/22/2022.

## 2022-10-21 ENCOUNTER — Telehealth: Payer: Self-pay

## 2022-10-21 NOTE — Telephone Encounter (Signed)
Patient's wife-Carolyn called stating that her husband will be seeing Dr. Tasia Catchings first and then they will reschedule his colonoscopy after that appointment. I then called Trish at the endoscopy unit to cancel it.

## 2022-10-22 ENCOUNTER — Inpatient Hospital Stay: Payer: Medicare Other | Attending: Oncology | Admitting: Hospice and Palliative Medicine

## 2022-10-22 DIAGNOSIS — C7A092 Malignant carcinoid tumor of the stomach: Secondary | ICD-10-CM | POA: Insufficient documentation

## 2022-10-22 DIAGNOSIS — D3A8 Other benign neuroendocrine tumors: Secondary | ICD-10-CM

## 2022-10-22 NOTE — Progress Notes (Signed)
Multidisciplinary Oncology Council Documentation  Ronnie Reyes was presented by our [facility] on 10/22/2022, which included representatives from:  Palliative Care Dietitian  Physical/Occupational Therapist Nurse Navigator Genetics Speech Therapist Social work Survivorship RN Financial Navigator Research RN   Ronnie Reyes currently presents with history of gastric neuroendocrine carcinoma   We reviewed previous medical and familial history, history of present illness, and recent lab results along with all available histopathologic and imaging studies. The [place] considered available treatment options and made the following recommendations/referrals:  None currently  The MOC is a meeting of clinicians from various specialty areas who evaluate and discuss patients for whom a multidisciplinary approach is being considered. Final determinations in the plan of care are those of the provider(s).   Today's extended care, comprehensive team conference, Ronnie Reyes was not present for the discussion and was not examined.

## 2022-10-28 ENCOUNTER — Encounter: Admission: RE | Payer: Self-pay | Source: Home / Self Care

## 2022-10-28 ENCOUNTER — Ambulatory Visit: Admission: RE | Admit: 2022-10-28 | Payer: Medicare Other | Source: Home / Self Care | Admitting: Gastroenterology

## 2022-10-28 SURGERY — COLONOSCOPY WITH PROPOFOL
Anesthesia: General

## 2022-11-03 ENCOUNTER — Inpatient Hospital Stay: Payer: Medicare Other

## 2022-11-03 DIAGNOSIS — C7A092 Malignant carcinoid tumor of the stomach: Secondary | ICD-10-CM | POA: Diagnosis present

## 2022-11-03 DIAGNOSIS — D3A8 Other benign neuroendocrine tumors: Secondary | ICD-10-CM

## 2022-11-03 LAB — CBC WITH DIFFERENTIAL/PLATELET
Abs Immature Granulocytes: 0.01 10*3/uL (ref 0.00–0.07)
Basophils Absolute: 0.1 10*3/uL (ref 0.0–0.1)
Basophils Relative: 1 %
Eosinophils Absolute: 0.2 10*3/uL (ref 0.0–0.5)
Eosinophils Relative: 3 %
HCT: 49.7 % (ref 39.0–52.0)
Hemoglobin: 16.6 g/dL (ref 13.0–17.0)
Immature Granulocytes: 0 %
Lymphocytes Relative: 33 %
Lymphs Abs: 2.2 10*3/uL (ref 0.7–4.0)
MCH: 30.6 pg (ref 26.0–34.0)
MCHC: 33.4 g/dL (ref 30.0–36.0)
MCV: 91.7 fL (ref 80.0–100.0)
Monocytes Absolute: 0.5 10*3/uL (ref 0.1–1.0)
Monocytes Relative: 7 %
Neutro Abs: 3.6 10*3/uL (ref 1.7–7.7)
Neutrophils Relative %: 56 %
Platelets: 182 10*3/uL (ref 150–400)
RBC: 5.42 MIL/uL (ref 4.22–5.81)
RDW: 12.6 % (ref 11.5–15.5)
WBC: 6.5 10*3/uL (ref 4.0–10.5)
nRBC: 0 % (ref 0.0–0.2)

## 2022-11-03 LAB — COMPREHENSIVE METABOLIC PANEL
ALT: 20 U/L (ref 0–44)
AST: 24 U/L (ref 15–41)
Albumin: 4.2 g/dL (ref 3.5–5.0)
Alkaline Phosphatase: 66 U/L (ref 38–126)
Anion gap: 9 (ref 5–15)
BUN: 11 mg/dL (ref 8–23)
CO2: 29 mmol/L (ref 22–32)
Calcium: 9 mg/dL (ref 8.9–10.3)
Chloride: 101 mmol/L (ref 98–111)
Creatinine, Ser: 0.84 mg/dL (ref 0.61–1.24)
GFR, Estimated: >60 mL/min (ref >60)
Glucose, Bld: 98 mg/dL (ref 70–99)
Potassium: 3.9 mmol/L (ref 3.5–5.1)
Sodium: 139 mmol/L (ref 135–145)
Total Bilirubin: 1.1 mg/dL (ref 0.3–1.2)
Total Protein: 7.3 g/dL (ref 6.5–8.1)

## 2022-11-05 LAB — CHROMOGRANIN A: Chromogranin A (ng/mL): 105.6 ng/mL — ABNORMAL HIGH (ref 0.0–101.8)

## 2022-11-10 ENCOUNTER — Telehealth: Payer: Self-pay

## 2022-11-10 NOTE — Telephone Encounter (Signed)
-----  Message from Earlie Server, MD sent at 11/09/2022 10:22 PM EST ----- Please schedule patient to follow up in 1 year. MD only. Thanks.

## 2022-11-10 NOTE — Telephone Encounter (Signed)
Ronnie Reyes can you please schedule patient to follow-up with Dr. Tasia Catchings in one year and notify patient of appointment date and time.

## 2022-12-04 ENCOUNTER — Other Ambulatory Visit: Payer: Self-pay | Admitting: *Deleted

## 2022-12-04 ENCOUNTER — Telehealth: Payer: Self-pay | Admitting: *Deleted

## 2022-12-04 DIAGNOSIS — Z86010 Personal history of colonic polyps: Secondary | ICD-10-CM

## 2022-12-04 MED ORDER — PEG 3350-KCL-NABCB-NACL-NASULF 236 G PO SOLR: 4000.0000 mL | Freq: Once | ORAL | 0 refills | Status: AC

## 2022-12-04 MED ORDER — NA SULFATE-K SULFATE-MG SULF 17.5-3.13-1.6 GM/177ML PO SOLN: 1.0000 | Freq: Once | ORAL | 0 refills | Status: AC

## 2022-12-04 NOTE — Telephone Encounter (Signed)
Patient's wife Hoyle Sauer) called back to reschedule patient's colonoscopy that was cancel back on 10/28/2022.  Colonoscopy is schedule for 12/17/2022. She stated that they did not pick up the Rx for prep solution. I have resend it for patient to [facility] as requested.  New instructions will be sent in the mail and available on MyChart.  Patient's wife verbalized understanding.

## 2022-12-04 NOTE — Addendum Note (Signed)
Addended by: Jacqualin Combes on: 12/04/2022 11:24 AM   Modules accepted: Orders

## 2022-12-16 ENCOUNTER — Encounter: Payer: Self-pay | Admitting: Gastroenterology

## 2022-12-17 ENCOUNTER — Encounter
Admission: RE | Disposition: A | Discharge status: Home / Self Care | Payer: Self-pay | Source: Ambulatory Visit | Attending: Gastroenterology

## 2022-12-17 ENCOUNTER — Ambulatory Visit
Admission: RE | Admit: 2022-12-17 | Discharge: 2022-12-17 | Disposition: A | Discharge status: Home / Self Care | Payer: Medicare Other | Source: Ambulatory Visit | Attending: Gastroenterology | Admitting: Gastroenterology

## 2022-12-17 ENCOUNTER — Ambulatory Visit: Payer: Medicare Other | Admitting: Certified Registered"

## 2022-12-17 DIAGNOSIS — K5730 Diverticulosis of large intestine without perforation or abscess without bleeding: Secondary | ICD-10-CM | POA: Diagnosis not present

## 2022-12-17 DIAGNOSIS — Z860100 Personal history of colon polyps, unspecified: Secondary | ICD-10-CM

## 2022-12-17 DIAGNOSIS — I10 Essential (primary) hypertension: Secondary | ICD-10-CM | POA: Insufficient documentation

## 2022-12-17 DIAGNOSIS — J449 Chronic obstructive pulmonary disease, unspecified: Secondary | ICD-10-CM | POA: Diagnosis not present

## 2022-12-17 DIAGNOSIS — Z1211 Encounter for screening for malignant neoplasm of colon: Secondary | ICD-10-CM | POA: Diagnosis present

## 2022-12-17 DIAGNOSIS — K219 Gastro-esophageal reflux disease without esophagitis: Secondary | ICD-10-CM | POA: Insufficient documentation

## 2022-12-17 DIAGNOSIS — D649 Anemia, unspecified: Secondary | ICD-10-CM | POA: Insufficient documentation

## 2022-12-17 DIAGNOSIS — D759 Disease of blood and blood-forming organs, unspecified: Secondary | ICD-10-CM | POA: Insufficient documentation

## 2022-12-17 HISTORY — PX: COLONOSCOPY WITH PROPOFOL: SHX5780

## 2022-12-17 SURGERY — COLONOSCOPY WITH PROPOFOL
Anesthesia: General

## 2022-12-17 MED ORDER — SODIUM CHLORIDE 0.9 % IV SOLN
INTRAVENOUS | Status: DC
  Administered 2022-12-17: 20 mL/h via INTRAVENOUS

## 2022-12-17 MED ORDER — PROPOFOL 10 MG/ML IV BOLUS
INTRAVENOUS | Status: DC | PRN
  Administered 2022-12-17: 100 mg via INTRAVENOUS
  Administered 2022-12-17: 120 ug/kg/min via INTRAVENOUS

## 2022-12-17 MED ORDER — PROPOFOL 10 MG/ML IV BOLUS
INTRAVENOUS | Status: AC
  Filled 2022-12-17: qty 40

## 2022-12-17 MED ORDER — LIDOCAINE HCL (CARDIAC) PF 100 MG/5ML IV SOSY
PREFILLED_SYRINGE | INTRAVENOUS | Status: DC | PRN
  Administered 2022-12-17: 100 mg via INTRAVENOUS

## 2022-12-17 NOTE — Anesthesia Preprocedure Evaluation (Signed)
Anesthesia Evaluation  Patient identified by MRN, date of birth, ID band Patient awake    Reviewed: Allergy & Precautions, H&P , NPO status , reviewed documented beta blocker date and time   History of Anesthesia Complications (+) history of anesthetic complications  Airway Mallampati: IV  TM Distance: >3 FB Neck ROM: full    Dental  (+) Chipped, Missing, Caps   Pulmonary asthma , COPD,  COPD inhaler   Pulmonary exam normal        Cardiovascular hypertension, Pt. on medications Normal cardiovascular exam     Neuro/Psych    GI/Hepatic ,GERD  Medicated and Controlled,,  Endo/Other    Renal/GU Renal disease     Musculoskeletal   Abdominal  (+) + obese  Peds  Hematology  (+) Blood dyscrasia, anemia   Anesthesia Other Findings Anemia Asthma 2003: Cancer ([place])     Comment:  skin cancer/arm and back No date: Complication of anesthesia     Comment:  HARD TIME WAKING UP AFTER  APPENDECTOMY GERD (gastroesophageal reflux disease) Hernia 2005: Hypertension     Comment:  since 2005 Past Surgical History: 01/02/2016: ANAL FISTULOTOMY; N/A     Comment:  Procedure: ANAL FISTULOTOMY;  Surgeon: Robert Bellow, MD;  Location: [facility] ORS;  Service: General;                Laterality: N/A; 2000: APPENDECTOMY 2006: CHOLECYSTECTOMY [id number]: COLONOSCOPY     Comment:  small serrated adenoma removed from the rectum at 15cm.               This was his first screening colonoscopy 01/02/2016: COLONOSCOPY WITH PROPOFOL; N/A     Comment:  Procedure: COLONOSCOPY WITH PROPOFOL;  Surgeon: Robert Bellow, MD;  Location: [facility] ENDOSCOPY;  Service:               Endoscopy;  Laterality: N/A; 11/19/2017: COLONOSCOPY WITH PROPOFOL; N/A     Comment:  Procedure: COLONOSCOPY WITH PROPOFOL;  Surgeon: Jonathon Bellows, MD;  Location: [facility] ENDOSCOPY;  Service:               Gastroenterology;  Laterality:  N/A; 11/19/2017: ESOPHAGOGASTRODUODENOSCOPY (EGD) WITH PROPOFOL; N/A     Comment:  Procedure: ESOPHAGOGASTRODUODENOSCOPY (EGD) WITH               PROPOFOL;  Surgeon: Jonathon Bellows, MD;  Location: [facility]               ENDOSCOPY;  Service: Gastroenterology;  Laterality: N/A; 1964: ingrown toenail removal 1974: NOSE SURGERY  BMI    Body Mass Index:  31.96 kg/m      Reproductive/Obstetrics                             Anesthesia Physical Anesthesia Plan  ASA: 3  Anesthesia Plan: General   Post-op Pain Management: Minimal or no pain anticipated   Induction: Intravenous  PONV Risk Score and Plan: 2 and TIVA and Propofol infusion  Airway Management Planned: Nasal Cannula and Natural Airway  Additional Equipment: None  Intra-op Plan:   Post-operative Plan:   Informed Consent: I have reviewed the patients History and Physical, chart, labs and discussed the procedure including the risks, benefits  and alternatives for the proposed anesthesia with the patient or authorized representative who has indicated his/her understanding and acceptance.     Dental Advisory Given  Plan Discussed with: CRNA, Anesthesiologist and Surgeon  Anesthesia Plan Comments: (Discussed risks of anesthesia with patient, including possibility of difficulty with spontaneous ventilation under anesthesia necessitating airway intervention, PONV, and rare risks such as cardiac or respiratory or neurological events, and allergic reactions. Discussed the role of CRNA in patient's perioperative care. Patient understands.)        Anesthesia Quick Evaluation

## 2022-12-17 NOTE — Anesthesia Postprocedure Evaluation (Signed)
Anesthesia Post Note  Patient: Ronnie Reyes  Procedure(s) Performed: COLONOSCOPY WITH PROPOFOL  Patient location during evaluation: Endoscopy Anesthesia Type: General Level of consciousness: awake and alert Pain management: pain level controlled Vital Signs Assessment: post-procedure vital signs reviewed and stable Respiratory status: spontaneous breathing, nonlabored ventilation, respiratory function stable and patient connected to nasal cannula oxygen Cardiovascular status: blood pressure returned to baseline and stable Postop Assessment: no apparent nausea or vomiting Anesthetic complications: no  No notable events documented.   Last Vitals:  Vitals:   12/17/22 1110 12/17/22 1120  BP: 105/62 112/61  Pulse: 67 63  Resp: 16 15  Temp:  (!) 36.3 C  SpO2: 95% 95%    Last Pain:  Vitals:   12/17/22 1120  TempSrc: Temporal  PainSc: 0-No pain                 Dimas Millin

## 2022-12-17 NOTE — Transfer of Care (Signed)
Immediate Anesthesia Transfer of Care Note  Patient: Ronnie Reyes  Procedure(s) Performed: COLONOSCOPY WITH PROPOFOL  Patient Location: PACU  Anesthesia Type:General  Level of Consciousness: drowsy  Airway & Oxygen Therapy: Patient Spontanous Breathing  Post-op Assessment: Report given to RN and Post -op Vital signs reviewed and stable  Post vital signs: Reviewed and stable  Last Vitals:  Vitals Value Taken Time  BP 97/68 1100  Temp 35.8 1100  Pulse 74 1100  Resp 16 1100  SpO2 96 1100    Last Pain:  Vitals:   12/17/22 1015  TempSrc: Temporal  PainSc: 0-No pain         Complications: No notable events documented.

## 2022-12-17 NOTE — H&P (Signed)
Jonathon Bellows, MD [address], [place], [place], [place], [address] [address], [place], [place], [place], [address] Phone: [phone]  Fax: [phone]  Primary Care Physician:  Gayland Curry, MD   Pre-Procedure History & Physical: HPI:  Ronnie Reyes is a 72 y.o. male is here for an colonoscopy.   Past Medical History:  Diagnosis Date   Anemia    Asthma    Cancer ([place]) 2003   skin cancer/arm and back   Complication of anesthesia    HARD TIME WAKING UP AFTER  APPENDECTOMY   GERD (gastroesophageal reflux disease)    Hernia    Hypertension 2005   since 2005    Past Surgical History:  Procedure Laterality Date   ANAL FISTULOTOMY N/A 01/02/2016   Procedure: ANAL FISTULOTOMY;  Surgeon: Robert Bellow, MD;  Location: [facility] ORS;  Service: General;  Laterality: N/A;   APPENDECTOMY  2000   CHOLECYSTECTOMY  2006   COLONOSCOPY  [id number]   small serrated adenoma removed from the rectum at 15cm. This was his first screening colonoscopy   COLONOSCOPY WITH PROPOFOL N/A 01/02/2016   Procedure: COLONOSCOPY WITH PROPOFOL;  Surgeon: Robert Bellow, MD;  Location: [facility] ENDOSCOPY;  Service: Endoscopy;  Laterality: N/A;   COLONOSCOPY WITH PROPOFOL N/A 11/19/2017   Procedure: COLONOSCOPY WITH PROPOFOL;  Surgeon: Jonathon Bellows, MD;  Location: [facility] ENDOSCOPY;  Service: Gastroenterology;  Laterality: N/A;   ESOPHAGOGASTRODUODENOSCOPY (EGD) WITH PROPOFOL N/A 11/19/2017   Procedure: ESOPHAGOGASTRODUODENOSCOPY (EGD) WITH PROPOFOL;  Surgeon: Jonathon Bellows, MD;  Location: [facility] ENDOSCOPY;  Service: Gastroenterology;  Laterality: N/A;   ESOPHAGOGASTRODUODENOSCOPY (EGD) WITH PROPOFOL N/A 05/20/2018   Procedure: ESOPHAGOGASTRODUODENOSCOPY (EGD) WITH PROPOFOL;  Surgeon: Jonathon Bellows, MD;  Location: [facility] ENDOSCOPY;  Service: Gastroenterology;  Laterality: N/A;   ESOPHAGOGASTRODUODENOSCOPY (EGD) WITH PROPOFOL N/A 04/08/2021   Procedure: ESOPHAGOGASTRODUODENOSCOPY (EGD) WITH PROPOFOL;  Surgeon: Jonathon Bellows, MD;  Location: [facility] ENDOSCOPY;  Service: Gastroenterology;  Laterality: N/A;   GIVENS CAPSULE STUDY N/A 05/20/2018   Procedure: GIVENS CAPSULE STUDY x 12 HR;  Surgeon: Jonathon Bellows, MD;  Location: [facility] ENDOSCOPY;  Service: Gastroenterology;  Laterality: N/A;   ingrown toenail removal  1964   NOSE SURGERY  1974    Prior to Admission medications   Medication Sig Start Date End Date Taking? Authorizing Provider  acetaminophen (TYLENOL) 500 MG tablet Take 1,250 mg by mouth.    [provider]  amLODipine (NORVASC) 10 MG tablet Take 10 mg by mouth every morning.  08/21/15   [provider]  atorvastatin (LIPITOR) 20 MG tablet Take by mouth. 10/23/17 10/07/22  [provider]  ferrous sulfate 324 (65 Fe) MG TBEC Take by mouth.    [provider]  fluticasone (FLONASE) 50 MCG/ACT nasal spray Place 1 spray into both nostrils daily.  08/17/15   [provider]  fluticasone furoate-vilanterol (BREO ELLIPTA) 100-25 MCG/INH AEPB Inhale 1 puff into the lungs daily.    [provider]  Glucos-Chondroit-Hyaluron-MSM (GLUCOSAMINE CHONDROITIN JOINT) TABS Take by mouth.    [provider]  Misc Natural Products (YUMVS BEET ROOT-TART CHERRY PO) Take by mouth.    [provider]  Multiple Vitamins-Minerals (CENTRUM SILVER PO) Take by mouth once.    [provider]  niacin 500 MG tablet Take 500 mg by mouth at bedtime.    [provider]  vitamin B-12 (CYANOCOBALAMIN) 1000 MCG tablet Take 1 tablet (1,000 mcg total) by mouth daily. 10/27/18   Earlie Server, MD  fexofenadine (ALLEGRA) 180 MG tablet  Take 180 mg by mouth daily.  08/11/19  [provider]  Loratadine 10 MG CAPS Take 10 mg by mouth.  08/11/19  [provider]  omeprazole (PRILOSEC) 40 MG capsule Take 1 capsule (40 mg total) by mouth daily. 11/11/17 08/11/19  Jonathon Bellows, MD    Allergies as of 12/04/2022 - Review Complete 10/07/2022  Allergen Reaction  Noted   Ace inhibitors Shortness Of Breath 08/28/2015   Chlorthalidone Anaphylaxis 08/28/2015   Pravastatin Rash 04/02/2017    Family History  Problem Relation Age of Onset   Cancer Father        kidney    Social History   Socioeconomic History   Marital status: Married    Spouse name: Not on file   Number of children: Not on file   Years of education: Not on file   Highest education level: Not on file  Occupational History   Not on file  Tobacco Use   Smoking status: Never   Smokeless tobacco: Never  Vaping Use   Vaping Use: Never used  Substance and Sexual Activity   Alcohol use: No    Alcohol/week: 0.0 standard drinks of alcohol   Drug use: No   Sexual activity: Yes  Other Topics Concern   Not on file  Social History Narrative   Not on file   Social Determinants of Health   Financial Resource Strain: Not on file  Food Insecurity: Not on file  Transportation Needs: Not on file  Physical Activity: Not on file  Stress: Not on file  Social Connections: Not on file  Intimate Partner Violence: Not on file    Review of Systems: See HPI, otherwise negative ROS  Physical Exam: There were no vitals taken for this visit. General:   Alert,  pleasant and cooperative in NAD Head:  Normocephalic and atraumatic. Neck:  Supple; no masses or thyromegaly. Lungs:  Clear throughout to auscultation, normal respiratory effort.    Heart:  +S1, +S2, Regular rate and rhythm, No edema. Abdomen:  Soft, nontender and nondistended. Normal bowel sounds, without guarding, and without rebound.   Neurologic:  Alert and  oriented x4;  grossly normal neurologically.  Impression/Plan: Ronnie Reyes is here for an colonoscopy to be performed for surveillance due to prior history of colon polyps   Risks, benefits, limitations, and alternatives regarding  colonoscopy have been reviewed with the patient.  Questions have been answered.  All parties agreeable.   Jonathon Bellows, MD   12/17/2022, 9:46 AM

## 2022-12-17 NOTE — Op Note (Signed)
[facility] Gastroenterology Patient Name: Ronnie Reyes Procedure Date: 12/17/2022 10:18 AM MRN: [id number] Account #: [id number] Date of Birth: Feb 26, 1951 Admit Type: Outpatient Age: 72 Room: [facility] ENDO ROOM 3 Gender: Male Note Status: Finalized Instrument Name: Jasper Riling [id number] Procedure:             Colonoscopy Indications:           Surveillance: Personal history of adenomatous polyps                         on last colonoscopy 5 years ago Providers:             Jonathon Bellows MD, MD Referring MD:          Gayland Curry MD, MD (Referring MD) Medicines:             Monitored Anesthesia Care Complications:         No immediate complications. Procedure:             Pre-Anesthesia Assessment:                        - Prior to the procedure, a History and Physical was                         performed, and patient medications, allergies and                         sensitivities were reviewed. The patient's tolerance                         of previous anesthesia was reviewed.                        - The risks and benefits of the procedure and the                         sedation options and risks were discussed with the                         patient. All questions were answered and informed                         consent was obtained.                        - ASA Grade Assessment: II - A patient with mild                         systemic disease.                        After obtaining informed consent, the colonoscope was                         passed under direct vision. Throughout the procedure,                         the patient's blood pressure, pulse, and oxygen  saturations were monitored continuously. The                         Colonoscope was introduced through the anus and                         advanced to the the cecum, identified by the                         appendiceal orifice. The colonoscopy was performed                          with ease. The patient tolerated the procedure well.                         The quality of the bowel preparation was good. The                         ileocecal valve, appendiceal orifice, and rectum were                         photographed. Findings:      The perianal and digital rectal examinations were normal.      Multiple medium-mouthed diverticula were found in the sigmoid colon.      The exam was otherwise without abnormality on direct and retroflexion       views. Impression:            - Diverticulosis in the sigmoid colon.                        - The examination was otherwise normal on direct and                         retroflexion views.                        - No specimens collected. Recommendation:        - Discharge patient to home (with escort).                        - Resume previous diet.                        - Continue present medications.                        - Repeat colonoscopy in 5 years for surveillance. Procedure Code(s):     --- Professional ---                        [phone], Colonoscopy, flexible; diagnostic, including                         collection of specimen(s) by brushing or washing, when                         performed (separate procedure) Diagnosis Code(s):     --- Professional ---  Z86.010, Personal history of colonic polyps                        K57.30, Diverticulosis of large intestine without                         perforation or abscess without bleeding CPT copyright 2022 [employer]. All rights reserved. The codes documented in this report are preliminary and upon coder review may  be revised to meet current compliance requirements. Jonathon Bellows, MD Jonathon Bellows MD, MD 12/17/2022 10:57:55 AM This report has been signed electronically. Number of Addenda: 0 Note Initiated On: 12/17/2022 10:18 AM Scope Withdrawal Time: 0 hours 13 minutes 31 seconds  Total Procedure Duration: 0 hours  15 minutes 30 seconds  Estimated Blood Loss:  Estimated blood loss: none.      [facility]

## 2022-12-18 ENCOUNTER — Encounter: Payer: Self-pay | Admitting: Gastroenterology

## 2023-07-13 DIAGNOSIS — I2510 Atherosclerotic heart disease of native coronary artery without angina pectoris: Secondary | ICD-10-CM

## 2023-07-13 HISTORY — DX: Atherosclerotic heart disease of native coronary artery without angina pectoris: I25.10

## 2023-07-30 ENCOUNTER — Other Ambulatory Visit: Payer: Self-pay | Admitting: Family Medicine

## 2023-07-30 DIAGNOSIS — I2510 Atherosclerotic heart disease of native coronary artery without angina pectoris: Secondary | ICD-10-CM

## 2023-07-30 DIAGNOSIS — I10 Essential (primary) hypertension: Secondary | ICD-10-CM

## 2023-07-31 ENCOUNTER — Ambulatory Visit
Admission: RE | Admit: 2023-07-31 | Discharge: 2023-07-31 | Disposition: A | Discharge status: Home / Self Care | Payer: Medicare Other | Source: Ambulatory Visit | Attending: Family Medicine | Admitting: Family Medicine

## 2023-07-31 DIAGNOSIS — I2510 Atherosclerotic heart disease of native coronary artery without angina pectoris: Secondary | ICD-10-CM | POA: Insufficient documentation

## 2023-07-31 DIAGNOSIS — I10 Essential (primary) hypertension: Secondary | ICD-10-CM | POA: Insufficient documentation

## 2023-11-10 NOTE — Assessment & Plan Note (Signed)
History of well-differentiated neuroendocrine carcinoma of stomach, status post submucosal resection with negative margin. I recommend patient to hold off his PPI and repeat chromogranin A level 2 weeks later Check CT chest abdomen pelvis

## 2023-11-11 ENCOUNTER — Inpatient Hospital Stay: Payer: Medicare Other | Attending: Oncology | Admitting: Oncology

## 2023-11-11 ENCOUNTER — Encounter: Payer: Self-pay | Admitting: Oncology

## 2023-11-11 VITALS — BP 134/79 | HR 73 | Temp 99.2°F | Resp 18 | Wt 201.0 lb

## 2023-11-11 DIAGNOSIS — D508 Other iron deficiency anemias: Secondary | ICD-10-CM | POA: Diagnosis not present

## 2023-11-11 DIAGNOSIS — K2940 Chronic atrophic gastritis without bleeding: Secondary | ICD-10-CM | POA: Insufficient documentation

## 2023-11-11 DIAGNOSIS — C7A8 Other malignant neuroendocrine tumors: Secondary | ICD-10-CM | POA: Diagnosis present

## 2023-11-11 DIAGNOSIS — Z8051 Family history of malignant neoplasm of kidney: Secondary | ICD-10-CM | POA: Insufficient documentation

## 2023-11-11 DIAGNOSIS — D509 Iron deficiency anemia, unspecified: Secondary | ICD-10-CM | POA: Diagnosis not present

## 2023-11-11 NOTE — Progress Notes (Signed)
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 He has been off PPI. Will check cbc cmp chromogranin A level- Check CT chest abdomen pelvis w contrast annually Follow up with GI for Repeat EGD for Gastric mapping    Iron deficiency anemia History of iron deficiency anemia.  Repeat CBC, CMP, iron TIBC ferritin  Orders Placed This Encounter  Procedures   CT CHEST ABDOMEN PELVIS W CONTRAST    Standing Status:   Future    Expected Date:   11/18/2023    Expiration Date:   11/10/2024    If indicated for the ordered procedure, I authorize the administration of contrast media per Radiology protocol:   Yes    Does the patient have a contrast media/X-ray dye allergy?:   No    Preferred imaging location?:   [facility]    If indicated for the ordered procedure, I authorize the administration of oral contrast media per Radiology protocol:   Yes   CBC with Differential/Platelet    Standing Status:   Future    Expected Date:   11/11/2023    Expiration Date:   11/10/2024   Comprehensive metabolic panel    Standing Status:   Future    Expected Date:   11/11/2023    Expiration Date:   11/10/2024   Chromogranin A    Standing Status:   Future    Expected Date:   11/11/2023    Expiration Date:   11/10/2024   Iron and TIBC    Standing Status:   Future    Expected Date:   11/11/2023    Expiration Date:   11/10/2024    Ferritin    Standing Status:   Future    Expected Date:   11/11/2023    Expiration Date:   11/10/2024   Follow up in 1  year.  All questions were answered. The patient knows to call the clinic with any problems, questions or concerns.  Rickard Patience, MD, PhD [facility] Hematology Oncology 11/11/2023   HISTORY OF PRESENTING ILLNESS:  Ronnie Reyes 73 y.o. male presents to establish care for history of gastric neuroendocrine carcinoma.  I have reviewed his chart and materials related to his cancer extensively and collaborated history with the patient. 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 Biopsied.- A single gastric polyp. Biopsied. 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 Large inflamed polyp revealed well differentiated neuroendocrine tumor, 2mm, +LVI, tumor extends to the deep biopsy base. pT1pNx  Histologic Type and Grade: G1, Well-differentiated neuroendocrine tumor  Mitotic Rate: 1 mitoses / 2 mm2  Ki-67 Labeling Index: <1 %    11/19/2017 Cancer Staging   Staging form: Stomach - Neuroendocrine Tumors, AJCC 8th Edition - Pathologic stage from 11/19/2017: Stage Unknown (  pT1, pNX, cM0) - Signed by Rickard Patience, MD on 10/14/2022 Stage prefix: Initial diagnosis Ki-67 (%): 1 Histologic grade (G): G1 Histologic grading system: 3 grade system   11/19/2017 Procedure   Colonoscopy - 2 tubular adenomas were excised    12/11/2017 Procedure   He has had submural resection of gastric neuroendocrine carcinoma resected at [facility]  Pathology showed Well-differentiated neuroendocrine tumor  Ki-67 Labeling Index:    < 3%  Tumor Size:    Greatest dimension in Centimeters (cm): 0.9 cm Tumor  Focality:    Unifocal  Tumor Extent:     Tumor Extension:    Tumor invades the lamina propria   Accessory Findings:      Lymphovascular Invasion:    Not identified   Perineural Invasion:    Not identified    Margins:        Deep Margin:    Uninvolved by tumor     Mucosal Margin:    Uninvolved by tumor      05/20/2018 Procedure   Upper EGD Normal examined duodenum. - Esophageal plaques were found, suspicious for candidiasis. - Normal stomach. - No specimens collected.   06/02/2018 Procedure   Capsule study showed area of mucosal inflammation and a possible ulceration in few locations.  Possible AVM with some bleeding.  Capsule did not exit the small bowel despite using 13-hour study.  It did exit the stomach within 90 minutes.  Prolonged small bowel transit.   Patient supposed to go to [facility] for second opinion regarding these lesions seen in the setting of iron deficient anemia and NET type I of stomach.  May require balloon enteroscopy to evaluate further.   A repeat capsule study showed some mucosal ulcerations and inflammation and was referred to [facility] for opinion.     08/2018 Procedure   Dr.Wild at [facility]  double-balloon enteroscopy performed November 2019, difficult procedure due to looping, tattoo was placed at the distal point it was reached  retrograde enteroscopy performed in December 2019 sites of surface ulceration and found biopsies just showed ulceration and reactive changes.  It was felt that previously seen issues were had resolved.      12/09/2018 Procedure   Lower double balloon assisted enteroscopy Showed normal colon. Ileitis He was treated with  6 week course of budesonide for small ileal ulcers      04/08/2021 Procedure   EGD features of gastritis seen. Gastric mapping performed features of intestinal metaplasia, chronic atrophic gastritis, inactive gastritis with seen.     Patient lost follow-up with me.  Patient was seen by me last on 10/27/2018.  He  recently called gastroenterology for evaluation of abdominal pain.  He was seen by Dr. Tobi Bastos on 08/04/2022.  Plan to repeat endoscopy in January 2024.  He was referred back to reestablish care with me. 08/06/2022, 5-HIAA level 3.2,  chromogranin A level 112   INTERVAL HISTORY Ronnie Reyes is a 73 y.o. male who has above history reviewed by me today presents for follow up visit for history of gastric neuroendocrine carcinoma He take Tums occasionally. Not taking PPI.  Denies any abdominal pain, unintentional weight loss.    MEDICAL HISTORY:  Past Medical History:  Diagnosis Date   Anemia    Asthma    Cancer (HCC) 2003   skin cancer/arm and back   Complication of anesthesia    HARD TIME WAKING UP AFTER  APPENDECTOMY   GERD (gastroesophageal reflux disease)    Hernia    Hypertension 2005  since 2005    SURGICAL HISTORY: Past Surgical History:  Procedure Laterality Date   ANAL FISTULOTOMY N/A 01/02/2016   Procedure: ANAL FISTULOTOMY;  Surgeon: Earline Mayotte, MD;  Location: [facility] ORS;  Service: General;  Laterality: N/A;   APPENDECTOMY  2000   CHOLECYSTECTOMY  2006   COLONOSCOPY  [id number]   small serrated adenoma removed from the rectum at 15cm. This was his first screening colonoscopy   COLONOSCOPY WITH PROPOFOL N/A 01/02/2016   Procedure: COLONOSCOPY WITH PROPOFOL;  Surgeon: Earline Mayotte, MD;  Location: [facility] ENDOSCOPY;  Service: Endoscopy;  Laterality: N/A;   COLONOSCOPY WITH PROPOFOL N/A 11/19/2017   Procedure: COLONOSCOPY WITH PROPOFOL;  Surgeon: Wyline Mood, MD;  Location: [facility] ENDOSCOPY;  Service: Gastroenterology;  Laterality: N/A;   COLONOSCOPY WITH PROPOFOL N/A 12/17/2022   Procedure: COLONOSCOPY WITH PROPOFOL;  Surgeon: Wyline Mood, MD;  Location[facility] ENDOSCOPY;  Service: Gastroenterology;  Laterality: N/A;   ESOPHAGOGASTRODUODENOSCOPY (EGD) WITH PROPOFOL N/A 11/19/2017   Procedure: ESOPHAGOGASTRODUODENOSCOPY (EGD) WITH PROPOFOL;  Surgeon: Wyline Mood, MD;   Location: [facility] ENDOSCOPY;  Service: Gastroenterology;  Laterality: N/A;   ESOPHAGOGASTRODUODENOSCOPY (EGD) WITH PROPOFOL N/A 05/20/2018   Procedure: ESOPHAGOGASTRODUODENOSCOPY (EGD) WITH PROPOFOL;  Surgeon: Wyline Mood, MD;  Location: [facility] ENDOSCOPY;  Service: Gastroenterology;  Laterality: N/A;   ESOPHAGOGASTRODUODENOSCOPY (EGD) WITH PROPOFOL N/A 04/08/2021   Procedure: ESOPHAGOGASTRODUODENOSCOPY (EGD) WITH PROPOFOL;  Surgeon: Wyline Mood, MD;  Location: [facility] ENDOSCOPY;  Service: Gastroenterology;  Laterality: N/A;   GIVENS CAPSULE STUDY N/A 05/20/2018   Procedure: GIVENS CAPSULE STUDY x 12 HR;  Surgeon: Wyline Mood, MD;  Location: [facility] ENDOSCOPY;  Service: Gastroenterology;  Laterality: N/A;   ingrown toenail removal  1964   NOSE SURGERY  1974    SOCIAL HISTORY: Social History   Socioeconomic History   Marital status: Married    Spouse name: Not on file   Number of children: Not on file   Years of education: Not on file   Highest education level: Not on file  Occupational History   Not on file  Tobacco Use   Smoking status: Never   Smokeless tobacco: Never  Vaping Use   Vaping status: Never Used  Substance and Sexual Activity   Alcohol use: No    Alcohol/week: 0.0 standard drinks of alcohol   Drug use: No   Sexual activity: Yes  Other Topics Concern   Not on file  Social History Narrative   Not on file   Social Drivers of Health   Financial Resource Strain: Low Risk  (01/08/2023)   Received from [facility], [facility]   Overall Financial Resource Strain (CARDIA)    Difficulty of Paying Living Expenses: Not hard at all  Food Insecurity: No Food Insecurity (01/08/2023)   Received from [facility], [facility]   Hunger Vital Sign    Worried About Running Out of Food in the Last Year: Never true    Ran Out of Food in the Last Year: Never true  Transportation Needs: No Transportation Needs (01/08/2023)    Received from [facility], [facility]   [facility] - Transportation    In the past 12 months, has lack of transportation kept you from medical appointments or from getting medications?: No    Lack of Transportation (Non-Medical): No  Physical Activity: Not on file  Stress: Not on file  Social Connections: Not on file  Intimate Partner Violence: Not on file    FAMILY HISTORY:  Family History  Problem Relation Age of Onset   Cancer Father        kidney    ALLERGIES:  is allergic to ace inhibitors, chlorthalidone, and pravastatin.  MEDICATIONS:  Current Outpatient Medications  Medication Sig Dispense Refill   acetaminophen (TYLENOL) 500 MG tablet Take 1,250 mg by mouth.     amLODipine (NORVASC) 10 MG tablet Take 10 mg by mouth every morning.   0   atorvastatin (LIPITOR) 80 MG tablet Take 80 mg by mouth daily.     ferrous sulfate 324 (65 Fe) MG TBEC Take by mouth.     fluticasone (FLONASE) 50 MCG/ACT nasal spray Place 1 spray into both nostrils daily.   3   fluticasone furoate-vilanterol (BREO ELLIPTA) 100-25 MCG/INH AEPB Inhale 1 puff into the lungs daily.     Glucos-Chondroit-Hyaluron-MSM (GLUCOSAMINE CHONDROITIN JOINT) TABS Take by mouth.     Misc Natural Products (YUMVS BEET ROOT-TART CHERRY PO) Take by mouth.     Multiple Vitamins-Minerals (CENTRUM SILVER PO) Take by mouth once.     niacin 500 MG tablet Take 500 mg by mouth at bedtime.     vitamin B-12 (CYANOCOBALAMIN) 1000 MCG tablet Take 1 tablet (1,000 mcg total) by mouth daily. 90 tablet 0   No current facility-administered medications for this visit.    Review of Systems  Constitutional:  Negative for appetite change, chills, fatigue, fever and unexpected weight change.  HENT:   Negative for hearing loss and voice change.   Eyes:  Negative for eye problems and icterus.  Respiratory:  Negative for chest tightness, cough and shortness of breath.   Cardiovascular:  Negative for chest pain  and leg swelling.  Gastrointestinal:  Negative for abdominal distention and abdominal pain.  Endocrine: Negative for hot flashes.  Genitourinary:  Negative for difficulty urinating, dysuria and frequency.   Musculoskeletal:  Negative for arthralgias.  Skin:  Negative for itching and rash.  Neurological:  Negative for light-headedness and numbness.  Hematological:  Negative for adenopathy. Does not bruise/bleed easily.  Psychiatric/Behavioral:  Negative for confusion.      PHYSICAL EXAMINATION: ECOG PERFORMANCE STATUS: 0 - Asymptomatic  Vitals:   11/11/23 1302  BP: 134/79  Pulse: 73  Resp: 18  Temp: 99.2 F (37.3 C)  SpO2: 96%   Filed Weights   11/11/23 1302  Weight: 201 lb (91.2 kg)    Physical Exam Constitutional:      General: He is not in acute distress.    Appearance: He is not diaphoretic.  HENT:     Head: Normocephalic and atraumatic.     Nose: Nose normal.  Eyes:     General: No scleral icterus. Cardiovascular:     Rate and Rhythm: Normal rate and regular rhythm.  Pulmonary:     Effort: Pulmonary effort is normal. No respiratory distress.  Abdominal:     General: There is no distension.     Palpations: Abdomen is soft.     Tenderness: There is no abdominal tenderness.  Musculoskeletal:        General: Normal range of motion.     Cervical back: Normal range of motion and neck supple.  Skin:    General: Skin is warm and dry.     Findings: No erythema.  Neurological:     Mental Status: He is alert and oriented to person, place, and time. Mental status is at baseline.     Motor: No abnormal muscle tone.  Psychiatric:  Mood and Affect: Mood and affect normal.      LABORATORY DATA:  I have reviewed the data as listed    Latest Ref Rng & Units 11/03/2022    9:53 AM 10/25/2018   11:04 AM 07/26/2018    1:24 PM  CBC  WBC 4.0 - 10.5 K/uL 6.5  7.1  8.2   Hemoglobin 13.0 - 17.0 g/dL 57.8  46.9  62.9   Hematocrit 39.0 - 52.0 % 49.7  46.6  46.2    Platelets 150 - 400 K/uL 182  241  220       Latest Ref Rng & Units 11/03/2022    9:53 AM 10/08/2022    8:32 AM 03/13/2021   10:38 AM  CMP  Glucose 70 - 99 mg/dL 98     BUN 8 - 23 mg/dL 11     Creatinine 5.28 - 1.24 mg/dL 4.13  2.44    Sodium 010 - 145 mmol/L 139     Potassium 3.5 - 5.1 mmol/L 3.9     Chloride 98 - 111 mmol/L 101     CO2 22 - 32 mmol/L 29     Calcium 8.9 - 10.3 mg/dL 9.0     Total Protein 6.5 - 8.1 g/dL 7.3     Total Bilirubin 0.3 - 1.2 mg/dL 1.1   0.8   Alkaline Phos 38 - 126 U/L 66     AST 15 - 41 U/L 24     ALT 0 - 44 U/L 20        RADIOGRAPHIC STUDIES: I have personally reviewed the radiological images as listed and agreed with the findings in the report. No results found.

## 2023-11-11 NOTE — Assessment & Plan Note (Signed)
History of iron deficiency anemia.  Repeat CBC, CMP, iron TIBC ferritin

## 2023-11-11 NOTE — Progress Notes (Signed)
 Patient is doing well, no new questions or concerns for the doctor today.

## 2023-11-17 ENCOUNTER — Telehealth: Payer: Self-pay

## 2023-11-17 NOTE — Telephone Encounter (Signed)
-----   Message from Wyline Mood sent at 11/17/2023  7:55 AM EST ----- Lets plan in march ----- Message ----- From: Adela Ports, CMA Sent: 11/16/2023   4:36 PM EST To: Wyline Mood, MD  Dr. Tobi Bastos, how soon do you want me to schedule it for? He was already in our recall list to reach out to him by the end of June and schedule it for July 2025. ----- Message ----- From: Wyline Mood, MD Sent: 11/16/2023  12:40 PM EST To: Adela Ports, CMA; Rickard Patience, MD  Kandis Cocking   Can we schedule EGD for gastric mapping   Thank you Dr Cathie Hoops for the catch   Kiran ----- Message ----- From: Rickard Patience, MD Sent: 11/11/2023   5:07 PM EST To: Wyline Mood, MD  Dr. Tobi Bastos,  [facility] you are doing well.  I have a question regarding to his EGD.  You mentioned in your note dated 08/04/2022 that there is plan to Repeat EGD for Gastric mapping. Was that done? I can see colonoscopy was done in Feb 2024 but not able to find EGD results.   Thank you.   Janyth Contes

## 2023-11-17 NOTE — Telephone Encounter (Signed)
Called patient to let him know that Dr. Cathie Hoops and Dr. Tobi Bastos recommended for him to have an EGD in March. Patient and his wife-Carolyn stated that they would not schedule anything else until they know what was the result of his CT Scan. I let him know that the physicians would like for him to schedule it until March and patient declined. However, I let him know that once he received results, to let us know. Patient agree.

## 2023-11-18 ENCOUNTER — Inpatient Hospital Stay: Payer: Medicare Other

## 2023-11-18 ENCOUNTER — Ambulatory Visit
Admission: RE | Admit: 2023-11-18 | Discharge: 2023-11-18 | Disposition: A | Discharge status: Home / Self Care | Payer: Medicare Other | Source: Ambulatory Visit | Attending: Oncology | Admitting: Oncology

## 2023-11-18 ENCOUNTER — Other Ambulatory Visit: Payer: Medicare Other

## 2023-11-18 DIAGNOSIS — C7A8 Other malignant neuroendocrine tumors: Secondary | ICD-10-CM | POA: Diagnosis present

## 2023-11-18 LAB — CBC WITH DIFFERENTIAL/PLATELET
Abs Immature Granulocytes: 0.03 10*3/uL (ref 0.00–0.07)
Basophils Absolute: 0 10*3/uL (ref 0.0–0.1)
Basophils Relative: 1 %
Eosinophils Absolute: 0.2 10*3/uL (ref 0.0–0.5)
Eosinophils Relative: 2 %
HCT: 46.3 % (ref 39.0–52.0)
Hemoglobin: 15.9 g/dL (ref 13.0–17.0)
Immature Granulocytes: 0 %
Lymphocytes Relative: 28 %
Lymphs Abs: 2.3 10*3/uL (ref 0.7–4.0)
MCH: 31.3 pg (ref 26.0–34.0)
MCHC: 34.3 g/dL (ref 30.0–36.0)
MCV: 91.1 fL (ref 80.0–100.0)
Monocytes Absolute: 0.7 10*3/uL (ref 0.1–1.0)
Monocytes Relative: 8 %
Neutro Abs: 4.9 10*3/uL (ref 1.7–7.7)
Neutrophils Relative %: 61 %
Platelets: 205 10*3/uL (ref 150–400)
RBC: 5.08 MIL/uL (ref 4.22–5.81)
RDW: 12.3 % (ref 11.5–15.5)
WBC: 8.1 10*3/uL (ref 4.0–10.5)
nRBC: 0 % (ref 0.0–0.2)

## 2023-11-18 LAB — COMPREHENSIVE METABOLIC PANEL
ALT: 21 U/L (ref 0–44)
AST: 27 U/L (ref 15–41)
Albumin: 4.1 g/dL (ref 3.5–5.0)
Alkaline Phosphatase: 65 U/L (ref 38–126)
Anion gap: 10 (ref 5–15)
BUN: 12 mg/dL (ref 8–23)
CO2: 24 mmol/L (ref 22–32)
Calcium: 9.1 mg/dL (ref 8.9–10.3)
Chloride: 102 mmol/L (ref 98–111)
Creatinine, Ser: 0.68 mg/dL (ref 0.61–1.24)
GFR, Estimated: >60 mL/min (ref >60)
Glucose, Bld: 95 mg/dL (ref 70–99)
Potassium: 3.7 mmol/L (ref 3.5–5.1)
Sodium: 136 mmol/L (ref 135–145)
Total Bilirubin: 1.1 mg/dL (ref 0.0–1.2)
Total Protein: 7.4 g/dL (ref 6.5–8.1)

## 2023-11-18 LAB — FERRITIN: Ferritin: 66 ng/mL (ref 24–336)

## 2023-11-18 LAB — IRON AND TIBC
Iron: 136 ug/dL (ref 45–182)
Saturation Ratios: 37 % (ref 17.9–39.5)
TIBC: 371 ug/dL (ref 250–450)
UIBC: 235 ug/dL

## 2023-11-18 MED ORDER — IOHEXOL 300 MG/ML  SOLN
100.0000 mL | Freq: Once | INTRAMUSCULAR | Status: AC | PRN
  Administered 2023-11-18: 100 mL via INTRAVENOUS

## 2023-11-19 LAB — CHROMOGRANIN A: Chromogranin A (ng/mL): 148.4 ng/mL — ABNORMAL HIGH (ref 0.0–101.8)

## 2023-11-24 ENCOUNTER — Telehealth: Payer: Self-pay | Admitting: Gastroenterology

## 2023-11-24 DIAGNOSIS — Z85828 Personal history of other malignant neoplasm of skin: Secondary | ICD-10-CM | POA: Insufficient documentation

## 2023-11-24 HISTORY — DX: Personal history of other malignant neoplasm of skin: Z85.828

## 2023-11-24 NOTE — Telephone Encounter (Signed)
The patient wife Eber Jones) called in to schedule her husband for a follow visit. She said that his tumor level is up, and he has diarrhea. I have him schedule with the PA and he is on the cancellation list.

## 2023-12-05 ENCOUNTER — Encounter: Payer: Self-pay | Admitting: Oncology

## 2023-12-05 DIAGNOSIS — I2510 Atherosclerotic heart disease of native coronary artery without angina pectoris: Secondary | ICD-10-CM | POA: Insufficient documentation

## 2023-12-05 DIAGNOSIS — I700 Atherosclerosis of aorta: Secondary | ICD-10-CM | POA: Insufficient documentation

## 2023-12-05 NOTE — Progress Notes (Unsigned)
 Cardiology Office Note  Date:  12/07/2023   ID:  Ronnie Reyes, Ronnie Reyes September 01, 1951, MRN [id number]  PCP:  Leim Fabry, MD   Chief Complaint  Patient presents with   New Patient (Initial Visit)    Ref by Dr. Dayna Barker for a CT scan November 18, 2023 with coronary artery calcification, aortic atherosclerosis.     HPI:  Mr. Ronnie Reyes is a 73 year old gentleman with past medical history of Primary neuroendocrine carcinoma of body of stomach  Iron deficiency anemia Hyperlipidemia Hypertension CT scan with aortic atherosclerosis and coronary calcification Calcium score 885 October 2024 Who presents by referral from Dr. Dayna Barker for coronary artery disease  On discussion today reports that he feels well Mild shortness of breath comes and goes, attributes this to low-grade asthma No prior smoking history Often working in the yard, using chainsaw, cutting trees down  CT scan November 18, 2023 images pulled up and reviewed  with coronary artery calcification, very mild aortic atherosclerosis No evidence of cancer  Calcium score 885 Calcium noted LAD and RCA, minimal and left circumflex Minimal in aorta  Lab work reviewed Total cholesterol 172 LDL 86 On Lipitor 40 daily at the time, dose has been increased since October 2024 up to 80 daily  EKG personally reviewed by myself on todays visit EKG Interpretation Date/Time:  Monday December 07 2023 14:42:34 EST Ventricular Rate:  76 PR Interval:  184 QRS Duration:  90 QT Interval:  394 QTC Calculation: 443 R Axis:   -12  Text Interpretation: Normal sinus rhythm When compared with ECG of 26-Dec-2015 10:29, No significant change was found Confirmed by Julien Nordmann [phone]) on 12/07/2023 3:06:06 PM    PMH:   has a past medical history of Anemia, Asthma, Cancer (HCC) (2003), Complication of anesthesia, GERD (gastroesophageal reflux disease), Hernia, and Hypertension (2005).  PSH:    Past Surgical History:  Procedure  Laterality Date   ANAL FISTULOTOMY N/A 01/02/2016   Procedure: ANAL FISTULOTOMY;  Surgeon: Earline Mayotte, MD;  Location: [facility] ORS;  Service: General;  Laterality: N/A;   APPENDECTOMY  2000   CHOLECYSTECTOMY  2006   COLONOSCOPY  [id number]   small serrated adenoma removed from the rectum at 15cm. This was his first screening colonoscopy   COLONOSCOPY WITH PROPOFOL N/A 01/02/2016   Procedure: COLONOSCOPY WITH PROPOFOL;  Surgeon: Earline Mayotte, MD;  Location: [facility] ENDOSCOPY;  Service: Endoscopy;  Laterality: N/A;   COLONOSCOPY WITH PROPOFOL N/A 11/19/2017   Procedure: COLONOSCOPY WITH PROPOFOL;  Surgeon: Wyline Mood, MD;  Location: [facility] ENDOSCOPY;  Service: Gastroenterology;  Laterality: N/A;   COLONOSCOPY WITH PROPOFOL N/A 12/17/2022   Procedure: COLONOSCOPY WITH PROPOFOL;  Surgeon: Wyline Mood, MD;  Location: [facility] ENDOSCOPY;  Service: Gastroenterology;  Laterality: N/A;   ESOPHAGOGASTRODUODENOSCOPY (EGD) WITH PROPOFOL N/A 11/19/2017   Procedure: ESOPHAGOGASTRODUODENOSCOPY (EGD) WITH PROPOFOL;  Surgeon: Wyline Mood, MD;  Location: [facility] ENDOSCOPY;  Service: Gastroenterology;  Laterality: N/A;   ESOPHAGOGASTRODUODENOSCOPY (EGD) WITH PROPOFOL N/A 05/20/2018   Procedure: ESOPHAGOGASTRODUODENOSCOPY (EGD) WITH PROPOFOL;  Surgeon: Wyline Mood, MD;  Location: [facility] ENDOSCOPY;  Service: Gastroenterology;  Laterality: N/A;   ESOPHAGOGASTRODUODENOSCOPY (EGD) WITH PROPOFOL N/A 04/08/2021   Procedure: ESOPHAGOGASTRODUODENOSCOPY (EGD) WITH PROPOFOL;  Surgeon: Wyline Mood, MD;  Location: [facility] ENDOSCOPY;  Service: Gastroenterology;  Laterality: N/A;   GIVENS CAPSULE STUDY N/A 05/20/2018   Procedure: GIVENS CAPSULE STUDY x 12 HR;  Surgeon: Wyline Mood, MD;  Location: [facility] ENDOSCOPY;  Service: Gastroenterology;  Laterality: N/A;   ingrown toenail removal  1964  NOSE SURGERY  1974    Current Outpatient Medications  Medication Sig Dispense Refill   acetaminophen (TYLENOL) 500 MG tablet Take 1,250 mg by mouth.      amLODipine (NORVASC) 10 MG tablet Take 10 mg by mouth every morning.   0   aspirin EC 81 MG tablet Take 81 mg by mouth daily.     atorvastatin (LIPITOR) 80 MG tablet Take 80 mg by mouth daily.     ferrous sulfate 324 (65 Fe) MG TBEC Take by mouth.     fluticasone (FLONASE) 50 MCG/ACT nasal spray Place 1 spray into both nostrils daily.   3   fluticasone furoate-vilanterol (BREO ELLIPTA) 100-25 MCG/INH AEPB Inhale 1 puff into the lungs daily.     gabapentin (NEURONTIN) 100 MG capsule Take 200 mg by mouth 3 (three) times daily.     Glucos-Chondroit-Hyaluron-MSM (GLUCOSAMINE CHONDROITIN JOINT) TABS Take by mouth.     metoprolol succinate (TOPROL-XL) 25 MG 24 hr tablet Take 25 mg by mouth daily.     Misc Natural Products (YUMVS BEET ROOT-TART CHERRY PO) Take by mouth.     Multiple Vitamins-Minerals (CENTRUM SILVER PO) Take by mouth once.     niacin 500 MG tablet Take 500 mg by mouth at bedtime.     vitamin B-12 (CYANOCOBALAMIN) 1000 MCG tablet Take 1 tablet (1,000 mcg total) by mouth daily. 90 tablet 0   No current facility-administered medications for this visit.     Allergies:   Ace inhibitors, Chlorthalidone, and Pravastatin   Social History:  The patient  reports that he has never smoked. He has never used smokeless tobacco. He reports that he does not drink alcohol and does not use drugs.   Family History:   family history includes Cancer in his father.    Review of Systems: Review of Systems  Constitutional: Negative.   HENT: Negative.    Respiratory: Negative.    Cardiovascular: Negative.   Gastrointestinal: Negative.   Musculoskeletal: Negative.   Neurological: Negative.   Psychiatric/Behavioral: Negative.    All other systems reviewed and are negative.   PHYSICAL EXAM: VS:  BP 116/80 (BP Location: Left Arm, Patient Position: Sitting, Cuff Size: Normal)   Ht 5\' 6"  (1.676 m)   Wt 200 lb (90.7 kg)   SpO2 95%   BMI 32.28 kg/m  , BMI Body mass index is 32.28 kg/m. GEN:  Well nourished, well developed, in no acute distress HEENT: normal Neck: no JVD, carotid bruits, or masses Cardiac: RRR; no murmurs, rubs, or gallops,no edema  Respiratory:  clear to auscultation bilaterally, normal work of breathing GI: soft, nontender, nondistended, + BS MS: no deformity or atrophy Skin: warm and dry, no rash Neuro:  Strength and sensation are intact Psych: euthymic mood, full affect  Recent Labs: 11/18/2023: ALT 21; BUN 12; Creatinine, Ser 0.68; Hemoglobin 15.9; Platelets 205; Potassium 3.7; Sodium 136    Lipid Panel No results found for: "CHOL", "HDL", "LDLCALC", "TRIG"    Wt Readings from Last 3 Encounters:  12/07/23 200 lb (90.7 kg)  11/11/23 201 lb (91.2 kg)  12/17/22 198 lb (89.8 kg)     ASSESSMENT AND PLAN:  Problem List Items Addressed This Visit       Cardiology Problems   Aortic atherosclerosis (HCC) - Primary   Relevant Medications   aspirin EC 81 MG tablet   metoprolol succinate (TOPROL-XL) 25 MG 24 hr tablet   Other Relevant Orders   EKG 12-Lead   Coronary artery calcification   Relevant  Medications   aspirin EC 81 MG tablet   metoprolol succinate (TOPROL-XL) 25 MG 24 hr tablet   Other Relevant Orders   EKG 12-Lead   Coronary calcification on CT Asymptomatic, denies significant shortness of breath or chest pain concerning for angina Has treated cholesterol, Lipitor 40 has been recently titrated up to 80 daily Discussed anginal symptoms to watch for, he currently denies any symptoms concerning for angina Active at baseline, chopping down trees, dragging tree limbs etc. without symptoms Wife who presents with him today reports that he is very active at baseline, " does more than me" Given lack of symptoms, no strong indication for stress testing at this time Would encourage continued lipid management He is non-smoker, no diabetes  Hyperlipidemia Lipitor increased up to 80 in October 2024 We have ordered lipid panel and LFTs, he did  not eat lunch today only had bowl of cereal this morning and it is  3 PM Goal LDL less than 55 If numbers continue to run high, could add Zetia 10 mg daily  Aortic atherosclerosis Minimal on CT scan chest abdomen pelvis Images pulled up and reviewed with him in detail  Essential hypertension Reports blood pressure running low at times, certainly well-controlled Recommend he monitor blood pressure when dizzy for low pressure, systolic pressure close to 100 For low pressures may need to cut amlodipine down to 5   Signed, Dossie Arbour, M.D., Ph.D. [facility] [place], [place] [phone]

## 2023-12-07 ENCOUNTER — Encounter: Payer: Self-pay | Admitting: Cardiovascular Disease

## 2023-12-07 ENCOUNTER — Ambulatory Visit: Payer: Medicare Other | Attending: Cardiovascular Disease | Admitting: Cardiovascular Disease

## 2023-12-07 VITALS — BP 120/80 | HR 76 | Ht 66.0 in | Wt 200.0 lb

## 2023-12-07 DIAGNOSIS — I2510 Atherosclerotic heart disease of native coronary artery without angina pectoris: Secondary | ICD-10-CM | POA: Diagnosis not present

## 2023-12-07 DIAGNOSIS — I700 Atherosclerosis of aorta: Secondary | ICD-10-CM | POA: Diagnosis not present

## 2023-12-07 DIAGNOSIS — Z79899 Other long term (current) drug therapy: Secondary | ICD-10-CM | POA: Diagnosis not present

## 2023-12-07 NOTE — Patient Instructions (Signed)
 Medication Instructions:  No changes  If you need a refill on your cardiac medications before your next appointment, please call your pharmacy.   Lab work: Lipids and LFTs  Testing/Procedures: No new testing needed  Follow-Up: At [facility], you and your health needs are our priority.  As part of our continuing mission to provide you with exceptional heart care, we have created designated Provider Care Teams.  These Care Teams include your primary Cardiologist (physician) and Advanced Practice Providers (APPs -  Physician Assistants and Nurse Practitioners) who all work together to provide you with the care you need, when you need it.  You will need a follow up appointment in 12 months  Providers on your designated Care Team:   Nicolasa Ducking, NP Eula Listen, PA-C Cadence Fransico Michael, [place]  COVID-19 Vaccine Information can be found at: [email or web address] For questions related to vaccine distribution or appointments, please email [email or web address] or call 5741615308.

## 2023-12-08 LAB — HEPATIC FUNCTION PANEL
ALT: 17 [IU]/L (ref 0–44)
AST: 22 [IU]/L (ref 0–40)
Albumin: 4.3 g/dL (ref 3.8–4.8)
Alkaline Phosphatase: 82 [IU]/L (ref 44–121)
Bilirubin Total: 0.9 mg/dL (ref 0.0–1.2)
Bilirubin, Direct: 0.27 mg/dL (ref 0.00–0.40)
Total Protein: 7 g/dL (ref 6.0–8.5)

## 2023-12-08 LAB — LIPID PANEL
Chol/HDL Ratio: 3.7 {ratio} (ref 0.0–5.0)
Cholesterol, Total: 155 mg/dL (ref 100–199)
HDL: 42 mg/dL (ref >39)
LDL Chol Calc (NIH): 66 mg/dL (ref 0–99)
Triglycerides: 297 mg/dL — ABNORMAL HIGH (ref 0–149)
VLDL Cholesterol Cal: 47 mg/dL — ABNORMAL HIGH (ref 5–40)

## 2023-12-11 ENCOUNTER — Encounter: Payer: Self-pay | Admitting: Cardiovascular Disease

## 2023-12-21 ENCOUNTER — Other Ambulatory Visit: Payer: Self-pay

## 2023-12-22 ENCOUNTER — Ambulatory Visit: Payer: Medicare Other | Admitting: Physician Assistant

## 2023-12-22 ENCOUNTER — Encounter: Payer: Self-pay | Admitting: Oncology

## 2023-12-22 ENCOUNTER — Encounter: Payer: Self-pay | Admitting: Physician Assistant

## 2023-12-22 VITALS — BP 135/81 | HR 72 | Temp 98.7°F | Ht 66.0 in | Wt 201.6 lb

## 2023-12-22 DIAGNOSIS — Z86018 Personal history of other benign neoplasm: Secondary | ICD-10-CM | POA: Diagnosis not present

## 2023-12-22 DIAGNOSIS — D3A8 Other benign neuroendocrine tumors: Secondary | ICD-10-CM

## 2023-12-22 DIAGNOSIS — R7989 Other specified abnormal findings of blood chemistry: Secondary | ICD-10-CM | POA: Diagnosis not present

## 2023-12-22 DIAGNOSIS — Z860101 Personal history of adenomatous and serrated colon polyps: Secondary | ICD-10-CM

## 2023-12-22 NOTE — Progress Notes (Signed)
 Ronnie Amy, PA-C [address]  [address]  [place], [place] [address]  Main: [phone]  Fax: [phone]   Primary Care Physician: Leim Fabry, MD  Primary Gastroenterologist:  Ronnie Amy, PA-C / Dr. Wyline Mood    CC: Follow-up neuroendocrine tumor of the stomach  HPI: Ronnie Reyes is a 73 y.o. male, established patient Dr. Tobi Bastos, returns for follow-up of neuroendocrine tumor of the stomach initially diagnosed in 2019.  04/08/2021: Last EGD features of gastritis seen.  Gastric mapping performed features of intestinal metaplasia, chronic atrophic gastritis, inactive gastritis with seen.  Recommended repeat EGD in 3 years.  12/17/22 Last colonoscopy by Dr. Tobi Bastos: No polyps.  Sigmoid diverticulosis.  Good prep.  5-year repeat (due 11/2027), (due to previous history of colon polyps).  Patient is followed by Dr. Cathie Hoops oncologist for neuroendocrine tumor of the stomach.  Patient has recently had diarrhea and his chromogranin A tumor marker level was up.  Dr. Cathie Hoops recommended patient go ahead and have a repeat EGD now.  Currently patient states his diarrhea has resolves.  He has chronic constipation and takes Miralax daily with benefit.  He denies abdominal pain, nausea or vomiting.  11/18/2023 CT chest abdomen pelvis with contrast: No evidence of neuroendocrine tumor within the stomach.  No evidence of metastatic neuroendocrine tumor disease.  11/18/2023 labs: Elevated chromogranin A of 148.4.  Normal ferritin 66, iron 136, iron saturation 37.  Normal CMP and CBC.  Hemoglobin 15.9.  GI HISTORY: 11/19/17- EGD: A few gastric sessile polyps were seen at the antrum and biopsies suggested intestinal metaplasia and chronic gastritis, atrophic gastritis-negative for H pylori . A 8 mm polyp was also seen close by - I only took sample biopsies from the edge WELL-DIFFERENTIATED NEUROENDOCRINE TUMOR, 2 MM. - LYMPHOVASCULAR INVASION. - TUMOR EXTENDS TO THE DEEP BIOPSY BASE. G1,  Well-differentiated neuroendocrine tumor Mitotic Rate: 1 mitoses / 2 mm2 ,Ki-67 Labeling Index: <1 % ,Tumor Extension: At least into the lamina propria ,Margins:  Tumor extends to the deep biopsy base    10/2017 Colonoscopy- 2 tubular adenomas were excised. 10/2017 - Capsule study - poor prep and did not exit out 11/2017 Underwent EUS and EMR of gastric polyp- well differentiated NET 11/27/17- Gastrin significantly elevated at 1364  Repeat capsule study in 2019 showed some mucosal ulcerations and inflammation and was referred to [facility] for opinion.   Subsequently seen and evaluated at [facility] by Dr. Genevie Ann and double-balloon enteroscopy performed November 2019, difficult procedure due to looping tattoo was placed at the distal point it was reached retrograde enteroscopy performed in December 2019 sites of surface ulceration and found biopsies just showed ulceration and reactive changes.  It was felt that previously seen issues were had resolved.     February 2020: CT enterography at [facility] showed abnormal appearance of left kidney with hydronephrosis sigmoid diverticulosis enlarged prostate.  He also had a capsule study on 12/14/2018 and was given a course of budesonide for 2 ilial ulcer seen.   Current Outpatient Medications  Medication Sig Dispense Refill   acetaminophen (TYLENOL) 500 MG tablet Take 1,250 mg by mouth.     amLODipine (NORVASC) 10 MG tablet Take 10 mg by mouth every morning.   0   aspirin EC 81 MG tablet Take 81 mg by mouth daily.     atorvastatin (LIPITOR) 80 MG tablet Take 80 mg by mouth daily.     ferrous sulfate 324 (65 Fe) MG TBEC Take by mouth.  fluorouracil (EFUDEX) 5 % cream Apply twice a day to pre-cancer/early cancer lesions for 5 days.     fluticasone (FLONASE) 50 MCG/ACT nasal spray Place 1 spray into both nostrils daily.   3   gabapentin (NEURONTIN) 100 MG capsule Take 200 mg by mouth 3 (three) times daily.     Glucos-Chondroit-Hyaluron-MSM (GLUCOSAMINE CHONDROITIN JOINT)  TABS Take by mouth.     metoprolol succinate (TOPROL-XL) 25 MG 24 hr tablet Take 25 mg by mouth daily.     Misc Natural Products (YUMVS BEET ROOT-TART CHERRY PO) Take by mouth.     Multiple Vitamins-Minerals (CENTRUM SILVER PO) Take by mouth once.     niacin 500 MG tablet Take 500 mg by mouth at bedtime.     vitamin B-12 (CYANOCOBALAMIN) 1000 MCG tablet Take 1 tablet (1,000 mcg total) by mouth daily. 90 tablet 0   Vitamin E Acetate 1 UNIT/MG LIQD Take by mouth.     No current facility-administered medications for this visit.    Allergies as of 12/22/2023 - Review Complete 12/22/2023  Allergen Reaction Noted   Ace inhibitors Shortness Of Breath 08/28/2015   Chlorthalidone Anaphylaxis 08/28/2015   Pravastatin Rash 04/02/2017    Past Medical History:  Diagnosis Date   Anemia    ASCVD (arteriosclerotic cardiovascular disease) 07/13/2023   Coronary artery calcification and aortic atherosclerotic   calcification.   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 Coronary calcium score of 885. This was 80th percentile for age   and sex matched control.      2. CAC >300 in LAD, LCx, RCA. CAC-DRS A3/N3.     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) - checked on 11/04/2022 without recurrence     BCC R upper back s/p excision 10/2022     Hx of malignant neuroendocrine tumor 12/11/2017   Was cause of acute drop in h/h recently 2019- with iron transfusions.- resolved anemia post treatment     Neuroendocrine neoplasm of stomach     Hypertension 2005   since  2005   Spinal stenosis of lumbar region without neurogenic claudication 01/06/2022   See PM and R   Lumbar MRI 2022 [facility]      Past Surgical History:  Procedure Laterality Date   ANAL FISTULOTOMY N/A 01/02/2016   Procedure: ANAL FISTULOTOMY;  Surgeon: Earline Mayotte, MD;  Location: [facility] ORS;  Service: General;  Laterality: N/A;   APPENDECTOMY  2000   CHOLECYSTECTOMY  2006   COLONOSCOPY  [id number]   small serrated adenoma removed from the rectum at 15cm. This was his first screening colonoscopy   COLONOSCOPY WITH PROPOFOL N/A 01/02/2016   Procedure: COLONOSCOPY WITH PROPOFOL;  Surgeon: Earline Mayotte, MD;  Location: [facility] ENDOSCOPY;  Service: Endoscopy;  Laterality: N/A;   COLONOSCOPY WITH PROPOFOL N/A 11/19/2017   Procedure: COLONOSCOPY WITH PROPOFOL;  Surgeon: Wyline Mood, MD;  Location: [facility] ENDOSCOPY;  Service: Gastroenterology;  Laterality: N/A;   COLONOSCOPY WITH PROPOFOL N/A 12/17/2022   Procedure: COLONOSCOPY WITH PROPOFOL;  Surgeon: Wyline Mood, MD;  Location: [facility] ENDOSCOPY;  Service: Gastroenterology;  Laterality: N/A;   ESOPHAGOGASTRODUODENOSCOPY (EGD) WITH PROPOFOL N/A 11/19/2017   Procedure: ESOPHAGOGASTRODUODENOSCOPY (EGD) WITH PROPOFOL;  Surgeon: Wyline Mood, MD;  Location: [facility] ENDOSCOPY;  Service: Gastroenterology;  Laterality: N/A;   ESOPHAGOGASTRODUODENOSCOPY (EGD) WITH PROPOFOL N/A 05/20/2018   Procedure: ESOPHAGOGASTRODUODENOSCOPY (EGD) WITH PROPOFOL;  Surgeon: Wyline Mood, MD;  Location: [facility] ENDOSCOPY;  Service: Gastroenterology;  Laterality: N/A;   ESOPHAGOGASTRODUODENOSCOPY (EGD) WITH PROPOFOL N/A 04/08/2021   Procedure: ESOPHAGOGASTRODUODENOSCOPY (EGD) WITH PROPOFOL;  Surgeon: Wyline Mood, MD;  Location: [facility] ENDOSCOPY;  Service: Gastroenterology;  Laterality: N/A;   GIVENS CAPSULE STUDY N/A 05/20/2018   Procedure: GIVENS CAPSULE STUDY x 12 HR;  Surgeon: Wyline Mood, MD;  Location: [facility] ENDOSCOPY;  Service: Gastroenterology;  Laterality: N/A;   ingrown toenail removal   1964   NOSE SURGERY  1974    Review of Systems:    All systems reviewed and negative except where noted in HPI.   Physical Examination:   BP 135/81   Pulse 72   Temp 98.7 F (37.1 C)   Ht 5\' 6"  (1.676 m)   Wt 201 lb 9.6 oz (91.4 kg)   BMI 32.54 kg/m   General: Well-nourished, well-developed in no acute distress.  Lungs: Clear to auscultation bilaterally. Non-labored. Heart: Regular rate and rhythm, no murmurs rubs or gallops.  Abdomen: Bowel sounds are normal; Abdomen is Soft; No hepatosplenomegaly, masses or hernias;  No Abdominal Tenderness; No guarding or rebound tenderness. Neuro: Alert and oriented x 3.  Grossly intact.  Psych: Alert and cooperative, normal mood and affect.   Imaging Studies: No results found.  Assessment and Plan:   Ronnie Reyes is a 73 y.o. y/o male returns for follow-up of:  1.  Neuroendocrine tumor of the stomach, initially diagnosed in 2019.  Followed by oncologist Dr. Cathie Hoops.  Recent chromogranin A level elevated.  Recent CT chest abdomen pelvis showed no recurrent neuroendocrine tumor or evidence of metastatic disease.  Scheduling repeat EGD for gastric mapping I discussed risks of EGD with patient to include risk of bleeding, perforation, and risk of sedation.  Patient expressed understanding and agrees to proceed with EGD.   2.  History of adenomatous colon polyps  5-year repeat colonoscopy will be due 11/2027.  Ronnie Amy, PA-C  Follow up as needed based on EGD results.

## 2024-01-19 ENCOUNTER — Encounter: Payer: Self-pay | Admitting: Gastroenterology

## 2024-01-20 ENCOUNTER — Ambulatory Visit: Admitting: Anesthesiology

## 2024-01-20 ENCOUNTER — Encounter
Admission: RE | Disposition: A | Discharge status: Home / Self Care | Payer: Self-pay | Source: Home / Self Care | Attending: Gastroenterology

## 2024-01-20 ENCOUNTER — Ambulatory Visit
Admission: RE | Admit: 2024-01-20 | Discharge: 2024-01-20 | Disposition: A | Discharge status: Home / Self Care | Attending: Gastroenterology | Admitting: Gastroenterology

## 2024-01-20 DIAGNOSIS — I2510 Atherosclerotic heart disease of native coronary artery without angina pectoris: Secondary | ICD-10-CM | POA: Insufficient documentation

## 2024-01-20 DIAGNOSIS — J4489 Other specified chronic obstructive pulmonary disease: Secondary | ICD-10-CM | POA: Insufficient documentation

## 2024-01-20 DIAGNOSIS — K31A19 Gastric intestinal metaplasia without dysplasia, unspecified site: Secondary | ICD-10-CM | POA: Diagnosis not present

## 2024-01-20 DIAGNOSIS — Z79899 Other long term (current) drug therapy: Secondary | ICD-10-CM | POA: Insufficient documentation

## 2024-01-20 DIAGNOSIS — K2940 Chronic atrophic gastritis without bleeding: Secondary | ICD-10-CM | POA: Insufficient documentation

## 2024-01-20 DIAGNOSIS — I10 Essential (primary) hypertension: Secondary | ICD-10-CM | POA: Insufficient documentation

## 2024-01-20 DIAGNOSIS — K219 Gastro-esophageal reflux disease without esophagitis: Secondary | ICD-10-CM | POA: Diagnosis not present

## 2024-01-20 DIAGNOSIS — K317 Polyp of stomach and duodenum: Secondary | ICD-10-CM | POA: Diagnosis not present

## 2024-01-20 DIAGNOSIS — K319 Disease of stomach and duodenum, unspecified: Secondary | ICD-10-CM | POA: Diagnosis not present

## 2024-01-20 DIAGNOSIS — D3A8 Other benign neuroendocrine tumors: Secondary | ICD-10-CM

## 2024-01-20 DIAGNOSIS — K31A0 Gastric intestinal metaplasia, unspecified: Secondary | ICD-10-CM | POA: Diagnosis present

## 2024-01-20 HISTORY — PX: ESOPHAGOGASTRODUODENOSCOPY: SHX5428

## 2024-01-20 SURGERY — EGD (ESOPHAGOGASTRODUODENOSCOPY)
Anesthesia: General

## 2024-01-20 MED ORDER — ONDANSETRON HCL 4 MG/2ML IJ SOLN
INTRAMUSCULAR | Status: AC
  Filled 2024-01-20: qty 2

## 2024-01-20 MED ORDER — LIDOCAINE HCL (PF) 2 % IJ SOLN
INTRAMUSCULAR | Status: AC
  Filled 2024-01-20: qty 5

## 2024-01-20 MED ORDER — ONDANSETRON HCL 4 MG/2ML IJ SOLN
INTRAMUSCULAR | Status: DC | PRN
Start: 2024-01-20 — End: 2024-01-20
  Administered 2024-01-20: 4 mg via INTRAVENOUS

## 2024-01-20 MED ORDER — SODIUM CHLORIDE 0.9 % IV SOLN
INTRAVENOUS | Status: DC
  Administered 2024-01-20: 20 mL/h via INTRAVENOUS

## 2024-01-20 MED ORDER — PROPOFOL 10 MG/ML IV BOLUS
INTRAVENOUS | Status: DC | PRN
  Administered 2024-01-20: 20 mg via INTRAVENOUS
  Administered 2024-01-20: 30 mg via INTRAVENOUS
  Administered 2024-01-20: 80 mg via INTRAVENOUS
  Administered 2024-01-20: 20 mg via INTRAVENOUS
  Administered 2024-01-20: 50 mg via INTRAVENOUS
  Administered 2024-01-20: 40 mg via INTRAVENOUS

## 2024-01-20 MED ORDER — PROPOFOL 10 MG/ML IV BOLUS
INTRAVENOUS | Status: AC
  Filled 2024-01-20: qty 40

## 2024-01-20 MED ORDER — LIDOCAINE HCL (CARDIAC) PF 100 MG/5ML IV SOSY
PREFILLED_SYRINGE | INTRAVENOUS | Status: DC | PRN
Start: 2024-01-20 — End: 2024-01-20
  Administered 2024-01-20: 100 mg via INTRAVENOUS

## 2024-01-20 NOTE — Transfer of Care (Signed)
 Immediate Anesthesia Transfer of Care Note  Patient: Ronnie Reyes  Procedure(s) Performed: EGD (ESOPHAGOGASTRODUODENOSCOPY)  Patient Location: PACU  Anesthesia Type:MAC  Level of Consciousness: awake  Airway & Oxygen Therapy: Patient Spontanous Breathing  Post-op Assessment: Report given to RN and Post -op Vital signs reviewed and stable  Post vital signs: Reviewed and stable  Last Vitals:  Vitals Value Taken Time  BP 126/87 01/20/24 1028  Temp 36.4 C 01/20/24 1019  Pulse 62 01/20/24 1031  Resp 13 01/20/24 1031  SpO2 97 % 01/20/24 1031  Vitals shown include unfiled device data.  Last Pain:  Vitals:   01/20/24 1019  TempSrc: Temporal  PainSc: 0-No pain         Complications: No notable events documented.

## 2024-01-20 NOTE — H&P (Signed)
 Wyline Mood, MD [address], [address], [place], [place], [address] [address], [address], [place], [place], [address] Phone: [phone]  Fax: [phone]  Primary Care Physician:  Leim Fabry, MD   Pre-Procedure History & Physical: HPI:  Ronnie Reyes is a 73 y.o. male is here for an endoscopy    Past Medical History:  Diagnosis Date   Anemia    ASCVD (arteriosclerotic cardiovascular disease) 07/13/2023   Coronary artery calcification and aortic atherosclerotic   calcification.   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 Coronary calcium score of 885. This was 80th percentile for age   and sex matched control.      2. CAC >300 in LAD, LCx, RCA. CAC-DRS A3/N3.     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) - checked on 11/04/2022 without recurrence     BCC R upper back s/p excision 10/2022     Hx of malignant neuroendocrine tumor 12/11/2017   Was cause of acute drop in h/h recently 2019- with iron transfusions.- resolved anemia post treatment     Neuroendocrine neoplasm of stomach     Hypertension 2005   since 2005   Spinal stenosis of lumbar region without neurogenic claudication 01/06/2022   See PM and R   Lumbar MRI 2022 [facility]      Past Surgical History:  Procedure Laterality Date   ANAL FISTULOTOMY N/A 01/02/2016   Procedure: ANAL FISTULOTOMY;  Surgeon: Earline Mayotte, MD;  Location: [facility] ORS;  Service: General;  Laterality: N/A;   APPENDECTOMY  2000   CHOLECYSTECTOMY  2006   COLONOSCOPY  [id number]   small  serrated adenoma removed from the rectum at 15cm. This was his first screening colonoscopy   COLONOSCOPY WITH PROPOFOL N/A 01/02/2016   Procedure: COLONOSCOPY WITH PROPOFOL;  Surgeon: Earline Mayotte, MD;  Location: [facility] ENDOSCOPY;  Service: Endoscopy;  Laterality: N/A;   COLONOSCOPY WITH PROPOFOL N/A 11/19/2017   Procedure: COLONOSCOPY WITH PROPOFOL;  Surgeon: Wyline Mood, MD;  Location: [facility] ENDOSCOPY;  Service: Gastroenterology;  Laterality: N/A;   COLONOSCOPY WITH PROPOFOL N/A 12/17/2022   Procedure: COLONOSCOPY WITH PROPOFOL;  Surgeon: Wyline Mood, MD;  Location: [facility] ENDOSCOPY;  Service: Gastroenterology;  Laterality: N/A;   ESOPHAGOGASTRODUODENOSCOPY (EGD) WITH PROPOFOL N/A 11/19/2017   Procedure: ESOPHAGOGASTRODUODENOSCOPY (EGD) WITH PROPOFOL;  Surgeon: Wyline Mood, MD;  Location: [facility] ENDOSCOPY;  Service: Gastroenterology;  Laterality: N/A;   ESOPHAGOGASTRODUODENOSCOPY (EGD) WITH PROPOFOL N/A 05/20/2018   Procedure: ESOPHAGOGASTRODUODENOSCOPY (EGD) WITH PROPOFOL;  Surgeon: Wyline Mood, MD;  Location: [facility] ENDOSCOPY;  Service: Gastroenterology;  Laterality: N/A;   ESOPHAGOGASTRODUODENOSCOPY (EGD) WITH PROPOFOL N/A 04/08/2021   Procedure: ESOPHAGOGASTRODUODENOSCOPY (EGD) WITH PROPOFOL;  Surgeon: Wyline Mood, MD;  Location: [facility] ENDOSCOPY;  Service: Gastroenterology;  Laterality: N/A;   GIVENS CAPSULE STUDY N/A 05/20/2018   Procedure: GIVENS CAPSULE STUDY x 12 HR;  Surgeon: Wyline Mood, MD;  Location: [facility] ENDOSCOPY;  Service: Gastroenterology;  Laterality: N/A;   ingrown toenail removal  1964   NOSE SURGERY  1974  Prior to Admission medications   Medication Sig Start Date End Date Taking? Authorizing Provider  acetaminophen (TYLENOL) 500 MG tablet Take 1,250 mg by mouth.    [provider]  amLODipine (NORVASC) 10 MG tablet Take 10 mg by mouth every morning.  08/21/15   [provider]  aspirin EC 81 MG tablet Take 81 mg by mouth daily. 07/13/23 07/12/24  [provider]  atorvastatin (LIPITOR) 80 MG tablet Take 80 mg by mouth daily.    [provider]  ferrous sulfate 324 (65 Fe) MG TBEC Take by mouth.    [provider]  fluorouracil (EFUDEX) 5 % cream Apply twice a day to pre-cancer/early cancer lesions for 5 days. 11/04/22   [provider]  fluticasone (FLONASE) 50 MCG/ACT nasal spray Place 1 spray into both nostrils daily.  08/17/15   [provider]  gabapentin (NEURONTIN) 100 MG capsule Take 200 mg by mouth 3 (three) times daily.    [provider]  Glucos-Chondroit-Hyaluron-MSM (GLUCOSAMINE CHONDROITIN JOINT) TABS Take by mouth.    [provider]  metoprolol succinate (TOPROL-XL) 25 MG 24 hr tablet Take 25 mg by mouth daily.    [provider]  Misc Natural Products (YUMVS BEET ROOT-TART CHERRY PO) Take by mouth.    [provider]  Multiple Vitamins-Minerals (CENTRUM SILVER PO) Take by mouth once.    [provider]  niacin 500 MG tablet Take 500 mg by mouth at bedtime.    [provider]  vitamin B-12 (CYANOCOBALAMIN) 1000 MCG tablet Take 1 tablet (1,000 mcg total) by mouth daily. 10/27/18   Rickard Patience, MD  Vitamin E Acetate 1 UNIT/MG LIQD Take by mouth.    [provider]  fexofenadine (ALLEGRA) 180 MG tablet Take 180 mg by mouth daily. Patient not taking: Reported on 12/07/2023  08/11/19  [provider]  Loratadine 10 MG CAPS Take 10 mg by mouth. Patient not taking: Reported on 12/07/2023  08/11/19  [provider]  omeprazole (PRILOSEC) 40 MG capsule Take 1 capsule (40 mg total) by mouth daily. Patient not taking: Reported on 12/07/2023 11/11/17 08/11/19  Wyline Mood, MD    Allergies as of 12/22/2023 - Review Complete 12/22/2023  Allergen Reaction Noted   Ace inhibitors Shortness Of Breath 08/28/2015   Chlorthalidone Anaphylaxis 08/28/2015   Pravastatin Rash 04/02/2017    Family History  Problem Relation Age of Onset   Cancer  Father        kidney    Social History   Socioeconomic History   Marital status: Married    Spouse name: Not on file   Number of children: Not on file   Years of education: Not on file   Highest education level: Not on file  Occupational History   Not on file  Tobacco Use   Smoking status: Never   Smokeless tobacco: Never  Vaping Use   Vaping status: Never Used  Substance and Sexual Activity   Alcohol use: No    Alcohol/week: 0.0 standard drinks of alcohol   Drug use: No   Sexual activity: Yes  Other Topics Concern   Not on file  Social History Narrative   Not on file   Social Drivers of Health   Financial Resource Strain: Low Risk  (01/14/2024)   Received from [facility]   Overall Financial Resource Strain (CARDIA)    Difficulty of Paying Living Expenses: Not hard at all  Food Insecurity: No Food Insecurity (01/14/2024)  Received from [facility]   Hunger Vital Sign    Worried About Running Out of Food in the Last Year: Never true    Ran Out of Food in the Last Year: Never true  Transportation Needs: No Transportation Needs (01/14/2024)   Received from [facility] - Transportation    In the past 12 months, has lack of transportation kept you from medical appointments or from getting medications?: No    Lack of Transportation (Non-Medical): No  Physical Activity: Not on file  Stress: Not on file  Social Connections: Not on file  Intimate Partner Violence: Not on file    Review of Systems: See HPI, otherwise negative ROS  Physical Exam: There were no vitals taken for this visit. General:   Alert,  pleasant and cooperative in NAD Head:  Normocephalic and atraumatic. Neck:  Supple; no masses or thyromegaly. Lungs:  Clear throughout to auscultation, normal respiratory effort.    Heart:  +S1, +S2, Regular rate and rhythm, No edema. Abdomen:  Soft, nontender and nondistended. Normal bowel sounds,  without guarding, and without rebound.   Neurologic:  Alert and  oriented x4;  grossly normal neurologically.  Impression/Plan: Ronnie Reyes is here for an endoscopy  to be performed for  evaluation of gastric mapping for intestinal metaplasia     Risks, benefits, limitations, and alternatives regarding endoscopy have been reviewed with the patient.  Questions have been answered.  All parties agreeable.   Wyline Mood, MD  01/20/2024, 9:06 AM

## 2024-01-20 NOTE — Anesthesia Postprocedure Evaluation (Signed)
 Anesthesia Post Note  Patient: Ronnie Reyes  Procedure(s) Performed: EGD (ESOPHAGOGASTRODUODENOSCOPY)  Patient location during evaluation: Endoscopy Anesthesia Type: General Level of consciousness: awake and alert Pain management: pain level controlled Vital Signs Assessment: post-procedure vital signs reviewed and stable Respiratory status: spontaneous breathing, nonlabored ventilation, respiratory function stable and patient connected to nasal cannula oxygen Cardiovascular status: blood pressure returned to baseline and stable Postop Assessment: no apparent nausea or vomiting Anesthetic complications: no   No notable events documented.   Last Vitals:  Vitals:   01/20/24 1036 01/20/24 1046  BP: (!) 142/81 (!) 147/86  Pulse:    Resp:    Temp:    SpO2:      Last Pain:  Vitals:   01/20/24 1046  TempSrc:   PainSc: 0-No pain                 Corinda Gubler

## 2024-01-20 NOTE — Anesthesia Preprocedure Evaluation (Signed)
 Anesthesia Evaluation  Patient identified by MRN, date of birth, ID band Patient awake    Reviewed: Allergy & Precautions, NPO status , Patient's Chart, lab work & pertinent test results  History of Anesthesia Complications Negative for: history of anesthetic complications  Airway Mallampati: II  TM Distance: >3 FB Neck ROM: Full    Dental  (+) Chipped   Pulmonary asthma , neg sleep apnea, COPD,  COPD inhaler, Patient abstained from smoking.Not current smoker   Pulmonary exam normal breath sounds clear to auscultation       Cardiovascular Exercise Tolerance: Good METShypertension, Pt. on medications + CAD  (-) Past MI (-) dysrhythmias  Rhythm:Regular Rate:Normal - Systolic murmurs    Neuro/Psych negative neurological ROS  negative psych ROS   GI/Hepatic ,GERD  ,,(+)     (-) substance abuse    Endo/Other  neg diabetes    Renal/GU negative Renal ROS     Musculoskeletal   Abdominal  (+) + obese  Peds  Hematology   Anesthesia Other Findings Past Medical History: No date: Anemia 07/13/2023: ASCVD (arteriosclerotic cardiovascular disease)     Comment:  Coronary artery calcification and aortic atherosclerotic              calcification.   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 Coronary              calcium score of 885. This was 80th percentile for age                 and sex matched control.     2. CAC >300 in LAD, LCx,               RCA. CAC-DRS A3/N3.   No date: Asthma 12/24/2021: Atrophic gastritis without hemorrhage 2003: Cancer (HCC)     Comment:  skin cancer/arm and back No date: Complication of anesthesia     Comment:  HARD TIME WAKING UP AFTER  APPENDECTOMY 12/26/2019: COPD, moderate (HCC) 07/11/2019: Essential hypertension No date: GERD (gastroesophageal reflux disease) No date: Hernia 11/24/2023: History of nonmelanoma skin cancer      Comment:  08/20/2018 right shoulder keratoacanthoma, checked on               05/23/2018 without recurrence  SCCis, L arm (05/24/2019) -               checked on 11/04/2022 without recurrence    BCC R upper               back s/p excision 10/2022   12/11/2017: Hx of malignant neuroendocrine tumor     Comment:  Was cause of acute drop in h/h recently 2019- with iron               transfusions.- resolved anemia post treatment                  Neuroendocrine neoplasm of stomach   2005: Hypertension     Comment:  since 2005 01/06/2022: Spinal stenosis of lumbar region without neurogenic  claudication     Comment:  See PM and R   Lumbar MRI 2022 [facility]    Reproductive/Obstetrics                             Anesthesia Physical Anesthesia Plan  ASA: 2  Anesthesia Plan: General   Post-op Pain Management: Minimal or no pain anticipated   Induction: Intravenous  PONV Risk Score and Plan: 2 and Propofol infusion, TIVA and Ondansetron  Airway Management Planned: Nasal Cannula  Additional Equipment: None  Intra-op Plan:   Post-operative Plan:   Informed Consent: I have reviewed the patients History and Physical, chart, labs and discussed the procedure including the risks, benefits and alternatives for the proposed anesthesia with the patient or authorized representative who has indicated his/her understanding and acceptance.     Dental advisory given  Plan Discussed with: CRNA and Surgeon  Anesthesia Plan Comments: (Discussed risks of anesthesia with patient, including possibility of difficulty with spontaneous ventilation under anesthesia necessitating airway intervention, PONV, and rare risks such as cardiac or respiratory or neurological events, and allergic reactions. Discussed the role of CRNA in patient's perioperative care. Patient understands.)       Anesthesia Quick Evaluation

## 2024-01-20 NOTE — Op Note (Signed)
 [facility] Gastroenterology Patient Name: Ronnie Reyes Procedure Date: 01/20/2024 9:55 AM MRN: [id number] Account #: [id number] Date of Birth: Mar 08, 1951 Admit Type: Outpatient Age: 73 Room: [facility] ENDO ROOM 3 Gender: Male Note Status: Finalized Instrument Name: Upper Endoscope [id number] Procedure:             Upper GI endoscopy Indications:           Follow-up of intestinal metaplasia Providers:             Wyline Mood MD, MD Referring MD:          Leim Fabry MD, MD (Referring MD) Medicines:             Monitored Anesthesia Care Complications:         No immediate complications. Procedure:             Pre-Anesthesia Assessment:                        - Prior to the procedure, a History and Physical was                         performed, and patient medications, allergies and                         sensitivities were reviewed. The patient's tolerance                         of previous anesthesia was reviewed.                        - The risks and benefits of the procedure and the                         sedation options and risks were discussed with the                         patient. All questions were answered and informed                         consent was obtained.                        - ASA Grade Assessment: II - A patient with mild                         systemic disease.                        After obtaining informed consent, the endoscope was                         passed under direct vision. Throughout the procedure,                         the patient's blood pressure, pulse, and oxygen                         saturations were monitored continuously. The Endoscope  was introduced through the mouth, and advanced to the                         third part of duodenum. The upper GI endoscopy was                         accomplished with ease. The patient tolerated the                         procedure well. Findings:       The examined duodenum was normal.      The esophagus was normal.      A single 6 mm sessile polyp with no stigmata of recent bleeding was       found on the greater curvature of the stomach. The polyp was removed       with a cold biopsy forceps. Resection and retrieval were complete.      Normal mucosa was found in the entire examined stomach. Biopsies were       taken with a cold forceps for histology.      The cardia and gastric fundus were normal on retroflexion. Impression:            - Normal examined duodenum.                        - Normal esophagus.                        - A single gastric polyp. Resected and retrieved.                        - Normal mucosa was found in the entire stomach.                         Biopsied. Recommendation:        - Await pathology results.                        - Discharge patient to home (with escort).                        - Resume previous diet.                        - Continue present medications.                        - Repeat upper endoscopy for surveillance and for                         surveillance based on pathology results. Procedure Code(s):     --- Professional ---                        [phone], Esophagogastroduodenoscopy, flexible,                         transoral; with biopsy, single or multiple Diagnosis Code(s):     --- Professional ---                        K31.7, Polyp  of stomach and duodenum                        K31.A0, Gastric intestinal metaplasia, unspecified CPT copyright 2022 American [employer] Association. All rights reserved. The codes documented in this report are preliminary and upon coder review may  be revised to meet current compliance requirements. Wyline Mood, MD Wyline Mood MD, MD 01/20/2024 10:16:12 AM This report has been signed electronically. Number of Addenda: 0 Note Initiated On: 01/20/2024 9:55 AM Estimated Blood Loss:  Estimated blood loss: none.      [facility]

## 2024-01-21 ENCOUNTER — Encounter: Payer: Self-pay | Admitting: Gastroenterology

## 2024-01-22 LAB — SURGICAL PATHOLOGY

## 2024-01-29 ENCOUNTER — Telehealth: Payer: Self-pay

## 2024-01-29 NOTE — Telephone Encounter (Signed)
error 

## 2024-02-17 ENCOUNTER — Encounter: Payer: Self-pay | Admitting: Gastroenterology

## 2024-11-10 ENCOUNTER — Inpatient Hospital Stay: Payer: Medicare Other | Attending: Oncology | Admitting: Oncology

## 2024-11-10 ENCOUNTER — Inpatient Hospital Stay

## 2024-11-10 ENCOUNTER — Encounter: Payer: Self-pay | Admitting: Oncology

## 2024-11-10 VITALS — BP 120/81 | HR 80 | Temp 98.1°F | Resp 18 | Wt 195.4 lb

## 2024-11-10 DIAGNOSIS — C7A8 Other malignant neuroendocrine tumors: Secondary | ICD-10-CM

## 2024-11-10 DIAGNOSIS — C7A092 Malignant carcinoid tumor of the stomach: Secondary | ICD-10-CM | POA: Insufficient documentation

## 2024-11-10 LAB — COMPREHENSIVE METABOLIC PANEL WITH GFR
ALT: 14 U/L (ref 0–44)
AST: 27 U/L (ref 15–41)
Albumin: 4.3 g/dL (ref 3.5–5.0)
Alkaline Phosphatase: 80 U/L (ref 38–126)
Anion gap: 12 (ref 5–15)
BUN: 17 mg/dL (ref 8–23)
CO2: 28 mmol/L (ref 22–32)
Calcium: 10.1 mg/dL (ref 8.9–10.3)
Chloride: 102 mmol/L (ref 98–111)
Creatinine, Ser: 0.86 mg/dL (ref 0.61–1.24)
GFR, Estimated: >60 mL/min
Glucose, Bld: 112 mg/dL — ABNORMAL HIGH (ref 70–99)
Potassium: 4.2 mmol/L (ref 3.5–5.1)
Sodium: 142 mmol/L (ref 135–145)
Total Bilirubin: 1.2 mg/dL (ref 0.0–1.2)
Total Protein: 7.3 g/dL (ref 6.5–8.1)

## 2024-11-10 LAB — CBC WITH DIFFERENTIAL/PLATELET
Abs Immature Granulocytes: 0.02 K/uL (ref 0.00–0.07)
Basophils Absolute: 0 K/uL (ref 0.0–0.1)
Basophils Relative: 0 %
Eosinophils Absolute: 0.2 K/uL (ref 0.0–0.5)
Eosinophils Relative: 3 %
HCT: 46.6 % (ref 39.0–52.0)
Hemoglobin: 15.5 g/dL (ref 13.0–17.0)
Immature Granulocytes: 0 %
Lymphocytes Relative: 28 %
Lymphs Abs: 2 K/uL (ref 0.7–4.0)
MCH: 30.7 pg (ref 26.0–34.0)
MCHC: 33.3 g/dL (ref 30.0–36.0)
MCV: 92.3 fL (ref 80.0–100.0)
Monocytes Absolute: 0.5 K/uL (ref 0.1–1.0)
Monocytes Relative: 7 %
Neutro Abs: 4.4 K/uL (ref 1.7–7.7)
Neutrophils Relative %: 62 %
Platelets: 198 K/uL (ref 150–400)
RBC: 5.05 MIL/uL (ref 4.22–5.81)
RDW: 12.6 % (ref 11.5–15.5)
WBC: 7.2 K/uL (ref 4.0–10.5)
nRBC: 0 % (ref 0.0–0.2)

## 2024-11-10 NOTE — Assessment & Plan Note (Addendum)
 History of well-differentiated neuroendocrine carcinoma of stomach, status post submucosal resection with negative margin. He has been off PPI. Will check cbc cmp chromogranin A level, 5-HIAA level.  If markers are persistently elevated, recommend repeat Dotatate PET scan

## 2024-11-10 NOTE — Progress Notes (Signed)
 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 He has been off PPI. Will check cbc cmp chromogranin A level, 5-HIAA level.  If markers are persistently elevated, recommend repeat Dotatate PET scan   Orders Placed This Encounter  Procedures   CBC with Differential/Platelet    Standing Status:   Future    Number of Occurrences:   1    Expected Date:   11/10/2024    Expiration Date:   02/08/2025   Comprehensive metabolic panel with GFR    Standing Status:   Future    Number of Occurrences:   1    Expected Date:   11/10/2024    Expiration Date:   02/08/2025   Chromogranin A    Standing Status:   Future    Number of Occurrences:   1    Expected Date:   11/10/2024    Expiration Date:   02/08/2025   5 HIAA, quantitative, urine, 24 hour    Standing Status:   Future    Expected Date:   11/10/2024    Expiration Date:   02/08/2025   Follow up in 1  year.  All questions were answered. The patient knows to call the clinic with any problems, questions or concerns.  Call Babara, MD, PhD [facility] Hematology Oncology 11/10/2024   HISTORY OF PRESENTING ILLNESS:  Ronnie Reyes 74 y.o. male presents to establish care for history of gastric neuroendocrine carcinoma.  I have reviewed his chart and materials related to his cancer extensively and collaborated history with the patient. 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 Biopsied.- A single gastric polyp. Biopsied. 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 Large inflamed polyp revealed well differentiated neuroendocrine tumor, 2mm, +LVI, tumor extends to the deep biopsy base. pT1pNx  Histologic Type and Grade: G1, Well-differentiated neuroendocrine tumor  Mitotic Rate: 1 mitoses / 2 mm2  Ki-67 Labeling Index: <1 %    11/19/2017 Cancer Staging   Staging form: Stomach - Neuroendocrine Tumors, AJCC 8th Edition - Pathologic stage from 11/19/2017: Stage Unknown (pT1, pNX, cM0) - Signed by Babara Call, MD on 10/14/2022 Stage prefix: Initial diagnosis Ki-67 (%): 1 Histologic grade (G): G1 Histologic grading system: 3 grade system   11/19/2017 Procedure   Colonoscopy - 2 tubular adenomas were excised    12/11/2017 Procedure   He has had submural resection of gastric neuroendocrine carcinoma resected at [facility]  Pathology showed Well-differentiated neuroendocrine tumor  Ki-67 Labeling Index:    < 3%  Tumor Size:    Greatest dimension in Centimeters (cm): 0.9 cm Tumor Focality:    Unifocal  Tumor Extent:     Tumor Extension:    Tumor invades the lamina propria   Accessory Findings:  Lymphovascular Invasion:    Not identified   Perineural Invasion:    Not identified    Margins:        Deep Margin:    Uninvolved by tumor     Mucosal Margin:    Uninvolved by tumor      05/20/2018 Procedure   Upper EGD Normal examined duodenum. - Esophageal plaques were found, suspicious for candidiasis. - Normal stomach. - No specimens collected.   06/02/2018 Procedure   Capsule study showed area of mucosal inflammation and a possible ulceration in few  locations.  Possible AVM with some bleeding.  Capsule did not exit the small bowel despite using 13-hour study.  It did exit the stomach within 90 minutes.  Prolonged small bowel transit.   Patient supposed to go to [facility] for second opinion regarding these lesions seen in the setting of iron  deficient anemia and NET type I of stomach.  May require balloon enteroscopy to evaluate further.   A repeat capsule study showed some mucosal ulcerations and inflammation and was referred to [facility] for opinion.     08/2018 Procedure   Dr.Wild at [facility]  double-balloon enteroscopy performed November 2019, difficult procedure due to looping, tattoo was placed at the distal point it was reached  retrograde enteroscopy performed in December 2019 sites of surface ulceration and found biopsies just showed ulceration and reactive changes.  It was felt that previously seen issues were had resolved.      12/09/2018 Procedure   Lower double balloon assisted enteroscopy Showed normal colon. Ileitis He was treated with  6 week course of budesonide for small ileal ulcers      04/08/2021 Procedure   EGD features of gastritis seen. Gastric mapping performed features of intestinal metaplasia, chronic atrophic gastritis, inactive gastritis with seen.     Patient lost follow-up with me.  Patient was seen by me last on 10/27/2018.  He recently called gastroenterology for evaluation of abdominal pain.  He was seen by Dr. Therisa on 08/04/2022.  Plan to repeat endoscopy in January 2024.  He was referred back to reestablish care with me. 08/06/2022, 5-HIAA level 3.2,  chromogranin A level 112   INTERVAL HISTORY Ronnie Reyes is a 74 y.o. male who has above history reviewed by me today presents for follow up visit for history of gastric neuroendocrine carcinoma He take Tums occasionally. Not taking PPI.  Denies any abdominal pain, unintentional weight loss.    MEDICAL HISTORY:  Past Medical History:  Diagnosis Date    Anemia    ASCVD (arteriosclerotic cardiovascular disease) 07/13/2023   Coronary artery calcification and aortic atherosclerotic   calcification.   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 Coronary calcium score of 885. This was 80th percentile for age   and sex matched control.      2. CAC >300 in LAD, LCx, RCA. CAC-DRS A3/N3.     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) - checked on 11/04/2022 without recurrence     BCC R upper back s/p  excision 10/2022     Hx of malignant neuroendocrine tumor 12/11/2017   Was cause of acute drop in h/h recently 2019- with iron  transfusions.- resolved anemia post treatment     Neuroendocrine neoplasm of stomach     Hypertension 2005   since 2005   Spinal stenosis of lumbar region without neurogenic claudication 01/06/2022   See PM and R   Lumbar MRI 2022 [facility]      SURGICAL HISTORY: Past Surgical History:  Procedure Laterality Date   ANAL FISTULOTOMY N/A 01/02/2016   Procedure: ANAL FISTULOTOMY;  Surgeon: Reyes LELON Cota, MD;  Location: [facility] ORS;  Service: General;  Laterality: N/A;   APPENDECTOMY  2000   CHOLECYSTECTOMY  2006   COLONOSCOPY  [id number]   small serrated adenoma removed from the rectum at 15cm. This was his first screening colonoscopy   COLONOSCOPY WITH PROPOFOL  N/A 01/02/2016   Procedure: COLONOSCOPY WITH PROPOFOL ;  Surgeon: Reyes LELON Cota, MD;  Location: [facility] ENDOSCOPY;  Service: Endoscopy;  Laterality: N/A;   COLONOSCOPY WITH PROPOFOL  N/A 11/19/2017   Procedure: COLONOSCOPY WITH  PROPOFOL ;  Surgeon: Therisa Bi, MD;  Location: [facility] ENDOSCOPY;  Service: Gastroenterology;  Laterality: N/A;   COLONOSCOPY WITH PROPOFOL  N/A 12/17/2022   Procedure: COLONOSCOPY WITH PROPOFOL ;  Surgeon: Therisa Bi, MD;  Location: [facility] ENDOSCOPY;  Service: Gastroenterology;  Laterality: N/A;   ESOPHAGOGASTRODUODENOSCOPY N/A 01/20/2024   Procedure: EGD (ESOPHAGOGASTRODUODENOSCOPY);  Surgeon: Therisa Bi, MD;  Location: [facility] ENDOSCOPY;  Service: Gastroenterology;  Laterality: N/A;   ESOPHAGOGASTRODUODENOSCOPY (EGD) WITH PROPOFOL  N/A 11/19/2017   Procedure: ESOPHAGOGASTRODUODENOSCOPY (EGD) WITH PROPOFOL ;  Surgeon: Therisa Bi, MD;  Location: [facility] ENDOSCOPY;  Service: Gastroenterology;  Laterality: N/A;   ESOPHAGOGASTRODUODENOSCOPY (EGD) WITH PROPOFOL  N/A 05/20/2018   Procedure: ESOPHAGOGASTRODUODENOSCOPY (EGD) WITH PROPOFOL ;  Surgeon: Therisa Bi, MD;  Location: [facility] ENDOSCOPY;  Service: Gastroenterology;  Laterality: N/A;   ESOPHAGOGASTRODUODENOSCOPY (EGD) WITH PROPOFOL  N/A 04/08/2021   Procedure: ESOPHAGOGASTRODUODENOSCOPY (EGD) WITH PROPOFOL ;  Surgeon: Therisa Bi, MD;  Location: [facility] ENDOSCOPY;  Service: Gastroenterology;  Laterality: N/A;   GIVENS CAPSULE STUDY N/A 05/20/2018   Procedure: GIVENS CAPSULE STUDY x 12 HR;  Surgeon: Therisa Bi, MD;  Location: [facility] ENDOSCOPY;  Service: Gastroenterology;  Laterality: N/A;   ingrown toenail removal  1964   NOSE SURGERY  1974    SOCIAL HISTORY: Social History   Socioeconomic History   Marital status: Married    Spouse name: Not on file   Number of children: Not on file   Years of education: Not on file   Highest education level: Not on file  Occupational History   Not on file  Tobacco Use   Smoking status: Never   Smokeless tobacco: Never  Vaping Use   Vaping status: Never Used  Substance and Sexual Activity   Alcohol use: No    Alcohol/week: 0.0 standard drinks of alcohol   Drug use: No   Sexual activity: Yes  Other Topics Concern   Not on  file  Social History Narrative   Not on file   Social Drivers of Health   Tobacco Use: Low Risk (11/10/2024)   Patient History    Smoking Tobacco Use: Never    Smokeless Tobacco Use: Never    Passive Exposure: Not on file  Financial Resource Strain: Low Risk  (01/14/2024)   Received from [facility]   Overall Financial Resource Strain (CARDIA)    Difficulty of Paying Living Expenses: Not hard at all  Food Insecurity: No Food Insecurity (01/14/2024)   Received from  [facility]   Epic    Within the past 12 months, you worried that your food would run out before you got the money to buy more.: Never true    Within the past 12 months, the food you bought just didn't last and you didn't have money to get more.: Never true  Transportation Needs: No Transportation Needs (01/14/2024)   Received from [facility] - Transportation    In the past 12 months, has lack of transportation kept you from medical appointments or from getting medications?: No    Lack of Transportation (Non-Medical): No  Physical Activity: Not on file  Stress: Not on file  Social Connections: Not on file  Intimate Partner Violence: Not on file  Depression (EYV7-0): Not on file  Alcohol Screen: Not on file  Housing: Unknown (01/14/2024)   Received from [facility]   Epic    In the last 12 months, was there a time when you were not able to pay the mortgage or rent on time?: No    Number of Times Moved in the Last Year: Not on file    At any time in the past 12 months, were you homeless or living in a shelter (including now)?: No  Utilities: Not At Risk (01/14/2024)   Received from [facility] Utilities    Threatened with loss of utilities: No  Health Literacy: Not on file    FAMILY HISTORY: Family History  Problem Relation Age of Onset   Cancer Father        kidney    ALLERGIES:  is allergic to ace  inhibitors, chlorthalidone, and pravastatin.  MEDICATIONS:  Current Outpatient Medications  Medication Sig Dispense Refill   acetaminophen  (TYLENOL ) 500 MG tablet Take 1,250 mg by mouth.     amLODipine (NORVASC) 10 MG tablet Take 10 mg by mouth every morning.   0   atorvastatin (LIPITOR) 80 MG tablet Take 80 mg by mouth daily.     ezetimibe (ZETIA) 10 MG tablet Take 10 mg by mouth daily.     ferrous sulfate 324 (65 Fe) MG TBEC Take by mouth.     fluorouracil (EFUDEX) 5 % cream Apply twice a day to pre-cancer/early cancer lesions for 5 days.     fluticasone (FLONASE) 50 MCG/ACT nasal spray Place 1 spray into both nostrils daily.   3   gabapentin (NEURONTIN) 100 MG capsule Take 200 mg by mouth 3 (three) times daily.     Glucos-Chondroit-Hyaluron-MSM (GLUCOSAMINE CHONDROITIN JOINT) TABS Take by mouth.     metoprolol succinate (TOPROL-XL) 25 MG 24 hr tablet Take 25 mg by mouth daily.     Misc Natural Products (YUMVS BEET ROOT-TART CHERRY PO) Take by mouth.     Multiple Vitamins-Minerals (CENTRUM SILVER PO) Take by mouth once.     niacin 500 MG tablet Take 500 mg by mouth at bedtime.     vitamin B-12 (CYANOCOBALAMIN) 1000 MCG tablet Take 1 tablet (1,000 mcg total) by mouth daily. 90 tablet 0   Vitamin E Acetate 1 UNIT/MG LIQD Take by mouth.     No current facility-administered medications for this visit.    Review of Systems  Constitutional:  Negative for appetite change, chills, fatigue, fever and unexpected weight change.  HENT:   Negative for hearing loss and voice change.   Eyes:  Negative for eye problems and icterus.  Respiratory:  Negative for chest tightness, cough and  shortness of breath.   Cardiovascular:  Negative for chest pain and leg swelling.  Gastrointestinal:  Negative for abdominal distention and abdominal pain.  Endocrine: Negative for hot flashes.  Genitourinary:  Negative for difficulty urinating, dysuria and frequency.   Musculoskeletal:  Negative for arthralgias.   Skin:  Negative for itching and rash.  Neurological:  Negative for light-headedness and numbness.  Hematological:  Negative for adenopathy. Does not bruise/bleed easily.  Psychiatric/Behavioral:  Negative for confusion.      PHYSICAL EXAMINATION: ECOG PERFORMANCE STATUS: 0 - Asymptomatic  Vitals:   11/10/24 1333 11/10/24 1339  BP: (!) 141/84 120/81  Pulse: 80   Resp: 18   Temp: 98.1 F (36.7 C)   SpO2: 94%    Filed Weights   11/10/24 1333  Weight: 195 lb 6.4 oz (88.6 kg)    Physical Exam Constitutional:      General: He is not in acute distress.    Appearance: He is not diaphoretic.  HENT:     Head: Normocephalic and atraumatic.     Nose: Nose normal.  Eyes:     General: No scleral icterus. Cardiovascular:     Rate and Rhythm: Normal rate and regular rhythm.  Pulmonary:     Effort: Pulmonary effort is normal. No respiratory distress.  Abdominal:     General: There is no distension.     Palpations: Abdomen is soft.     Tenderness: There is no abdominal tenderness.  Musculoskeletal:        General: Normal range of motion.     Cervical back: Normal range of motion and neck supple.  Skin:    General: Skin is warm and dry.     Findings: No erythema.  Neurological:     Mental Status: He is alert and oriented to person, place, and time. Mental status is at baseline.     Motor: No abnormal muscle tone.  Psychiatric:        Mood and Affect: Mood and affect normal.      LABORATORY DATA:  I have reviewed the data as listed    Latest Ref Rng & Units 11/10/2024    1:54 PM 11/18/2023    1:30 PM 11/03/2022    9:53 AM  CBC  WBC 4.0 - 10.5 K/uL 7.2  8.1  6.5   Hemoglobin 13.0 - 17.0 g/dL 84.4  84.0  83.3   Hematocrit 39.0 - 52.0 % 46.6  46.3  49.7   Platelets 150 - 400 K/uL 198  205  182       Latest Ref Rng & Units 11/10/2024    1:54 PM 12/07/2023    3:13 PM 11/18/2023    1:30 PM  CMP  Glucose 70 - 99 mg/dL 887   95   BUN 8 - 23 mg/dL 17   12   Creatinine 9.38  - 1.24 mg/dL 9.13   9.31   Sodium 864 - 145 mmol/L 142   136   Potassium 3.5 - 5.1 mmol/L 4.2   3.7   Chloride 98 - 111 mmol/L 102   102   CO2 22 - 32 mmol/L 28   24   Calcium 8.9 - 10.3 mg/dL 89.8   9.1   Total Protein 6.5 - 8.1 g/dL 7.3  7.0  7.4   Total Bilirubin 0.0 - 1.2 mg/dL 1.2  0.9  1.1   Alkaline Phos 38 - 126 U/L 80  82  65   AST 15 - 41 U/L 27  22  27   ALT 0 - 44 U/L 14  17  21       RADIOGRAPHIC STUDIES: I have personally reviewed the radiological images as listed and agreed with the findings in the report. No results found.  "

## 2024-11-11 LAB — CHROMOGRANIN A: Chromogranin A (ng/mL): 127.3 ng/mL — ABNORMAL HIGH (ref 0.0–101.8)

## 2024-11-16 ENCOUNTER — Other Ambulatory Visit: Payer: Self-pay

## 2024-11-16 DIAGNOSIS — C7A092 Malignant carcinoid tumor of the stomach: Secondary | ICD-10-CM | POA: Diagnosis not present

## 2024-11-16 DIAGNOSIS — C7A8 Other malignant neuroendocrine tumors: Secondary | ICD-10-CM

## 2024-11-23 LAB — 5 HIAA, QUANTITATIVE, URINE, 24 HOUR
5-HIAA, Ur: 3 mg/L
5-HIAA,Quant.,24 Hr Urine: 5.7 mg/(24.h) (ref 0.0–14.9)
Total Volume: 1900
